# Patient Record
Sex: Female | Born: 1990 | Race: Black or African American | Hispanic: No | Marital: Single | Smoking: Current every day smoker
Health system: Southern US, Community
[De-identification: ages and names within clinical notes are randomized; demographics above are authoritative.]

## PROBLEM LIST (undated history)

## (undated) DIAGNOSIS — R569 Unspecified convulsions: Secondary | ICD-10-CM

---
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
## 2010-07-16 ENCOUNTER — Emergency Department (HOSPITAL_COMMUNITY): Admission: EM | Admit: 2010-07-16 | Discharge: 2010-07-16 | Payer: Self-pay | Admitting: Family Medicine

## 2011-02-24 NOTE — Op Note (Signed)
[name], April Burnett               ACCOUNT NO.:  [id number]   MEDICAL RECORD NO.:  [id number]          PATIENT TYPE:  AMB   LOCATION:  SDS                          FACILITY:  [facility]   PHYSICIAN:  Karol T. Lazarus Salines, M.D. DATE OF BIRTH:  [birth year]   DATE OF PROCEDURE:  DATE OF DISCHARGE:                               OPERATIVE REPORT   PREOPERATIVE DIAGNOSIS:  Right forehead abscess, culture positive for  methicillin-resistant Staphylococcus aureus.   POSTOPERATIVE DIAGNOSIS:  Right forehead abscess, culture positive for  methicillin-resistant Staphylococcus aureus.   PROCEDURE PERFORMED:  Incision and drainage, right forehead abscess.   SURGEON:  Gloris Manchester. Lazarus Salines, MD   ANESTHESIA:  General LMA.   BLOOD LOSS:  None.   COMPLICATIONS:  None.   FINDINGS:  A small incision and drainage site with a piece of Penrose  drain, however, not adequately drained.   PROCEDURE:  With the patient in a comfortable supine position, general  LMA anesthesia was administered.  At an appropriate level, the patient  was placed in a slight reverse Trendelenburg.  A clean preparation and  draping of the face was accomplished.   The previous Penrose drain was removed.  There was some additional pus  that was expressed.  Using hemostat, the cavity was explored including  the superior extension.  Additional loculations of pus were broken down  and drained.  The cavity was irrigated with 60 mL of 50:50 mixture of  Betadine solution and saline.  Hemostasis was spontaneous.  A sliver of  Penrose drain was placed into the depth of the cavity and then secured  at the skin edge with a 3-0 Ethilon stitch.  An absorbent dressing was  placed.  This completed the procedure.  The patient was returned to  Anesthesia, awakened, extubated, and transferred to recovery in stable  condition.   COMMENT:  A 20 year old black female underwent incision and drainage of  a right forehead abscess/infected epithelial cyst 5  days ago.  She has  had progressive disease despite being on clindamycin.  The culture did  show MRSA sensitive to clindamycin from that particular encounter.  Additional drainage was achieved today.  Given low anticipated risk of  postanesthetic or postsurgical complications, I feel an outpatient venue  is appropriate.      Gloris Manchester. Lazarus Salines, M.D.  Electronically Signed     KTW/MEDQ  D:  09/05/2008  T:  09/06/2008  Job:  [id number]

## 2011-02-24 NOTE — Consult Note (Signed)
April Burnett, April Burnett               ACCOUNT NO.:  [id number]   MEDICAL RECORD NO.:  [id number]          PATIENT TYPE:  EMS   LOCATION:  MAJO                         FACILITY:  [facility]   PHYSICIAN:  Karol T. Lazarus Salines, M.D. DATE OF BIRTH:  1990/11/25   DATE OF CONSULTATION:  09/02/2008  DATE OF DISCHARGE:  09/02/2008                                 CONSULTATION   CHIEF COMPLAINT:  Right forehead swelling.   HISTORY:  A 20 year old black female has developed progressively tender  swelling over her right forehead over roughly 48 hours' time.  It was  too painful to sleep this evening.  She did attempt to express something  and got a small quantity of pus with the infections continues to be  swollen and tender.  On specific questioning, there may have been a  small cyst or something in that area previously.  She is not diabetic or  otherwise immune compromised.  No history of MRSA.  She does form  keloids on both ear lobules related to earrings.  She is otherwise  healthy.   PHYSICAL EXAMINATION:  This is a slightly overweight adolescent black  female with an erythematous tender swelling just above the medial right  eyebrow in the forehead skin.  There is slight excoriation and a honey-  colored eschar over the center of the area consistent with a draining  abscess.  No obvious active drainage.  The right upper eyelid is  swollen, but the eye itself does not hurt.  Full range of motion and  intact vision.  No other examination was performed except to note that  she has some old acne scarring and slight hyperpigmentation and  prominent keloids on the posterior surface of the auricular lobule on  both sides.   IMPRESSION:  Right forehead abscess?  Infected epithelial cyst.  Possible methicillin-resistant Staphylococcus aureus.   PLAN:  With informed consent, I anesthetized the area beginning into the  lateral brow and working medially using 7 mL of 1% Xylocaine with  1:100,000 epinephrine  in stages.  An ice pack was used initially to help  with the needle stick.  After achieving adequate anesthesia, I palpated  the area further.  I had planned to put the incision in the eyebrow, but  the lesion itself is roughly 1 cm above the eyebrow and I incised  directly into the lesion using the tip of a #15 blade scalpel.  A small  amount of pus was encountered.  The opening was deepened with a hemostat  and some pus and epithelial debris was encountered.  This was cultured.  With forceful expression, loculations were broken down in all material  was delivered to the surface.  A sliver of Penrose drain was placed into  the wound and secured to the skin with a 3-0 Ethilon stitch.  She  tolerated the procedure nicely.  We cleaned the wound and applied  antibiotic ointment and a Band-Aid.  She is recommended to use an ice  pack for 24 hours, keep the head elevated for several days, and check  back in my office in 1-2  days for removal of the drain.  I doubt that  she will develop a keloid at this point, but we will be prepared to deal  with that in the office.  She was asked to contact me for worsening of  the infection.  I gave her prescriptions for Tylenol No. 3 for pain  relief, #20; and clindamycin 300 mg t.i.d. for 1 week.  We will call her  when the culture results become available and change antibiotics as  needed.      Gloris Manchester. Lazarus Salines, M.D.  Electronically Signed    KTW/MEDQ  D:  09/02/2008  T:  09/02/2008  Job:  [id number]

## 2011-02-27 NOTE — Op Note (Signed)
NAMEECE, CUMBERLAND                           ACCOUNT NO.:  [id number]   MEDICAL RECORD NO.:  [id number]                   PATIENT TYPE:  AMB   LOCATION:  DSC                                  FACILITY:  [facility]   PHYSICIAN:  Christopher E. Ezzard Standing, M.D.         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   OPERATION:  Adenoidectomy with bilateral inferior turbinate reductions.   SURGEON:  Kristine Garbe. Ezzard Standing, M.D.   ANESTHESIA:  General endotracheal anesthesia.   COMPLICATIONS:  None.   BRIEF CLINICAL NOTE:  Genine Beckett is a 20 year old who has had chronic  nasal obstruction with a tendency to breathe through her mouth and snores  heavily at night.  On examination, she has large, swollen turbinates as well  as moderately large adenoid tissue.  She is taken to the operating room at  this time for adenoidectomy and turbinate reduction.   DESCRIPTION OF PROCEDURE:  After adequate endotracheal anesthesia, the  nasopharynx was examined first.  The mouth gag was used to expose the  oropharynx.  A red rubber catheter was passed through the nose and out the  mouth to retract the soft palate.  The nasopharynx was examined with a  mirror.  Candee had moderate size adenoid tissue.  An adenoid curet was  used to remove the central pad of adenoid tissue.  Nasopharyngeal packs were  placed for hemostasis.  These were then removed and further hemostasis was  obtained with suction cautery.  After obtaining adequate hemostasis, the  procedure was completed.  The nose and nasopharynx was irrigated with  saline.   Following this, the nose was examined.  Anniah had large inferior  turbinates bilaterally, right  side a little bit larger than the left.  Using  the L-Med bipolar cautery, submucosal cauterization was performed of the  inferior turbinates bilaterally and the turbinate bone was outfractured.  This completed the procedure.  Deleah was awakened from anesthesia and  transferred to the recovery room postoperatively doing well.   DISPOSITION:  Nissi is discharged home later this morning on Tylenol #3  p.r.n. pain.  Will have her follow up in my office in two weeks for recheck.                                               Kristine Garbe. Ezzard Standing, M.D.    CEN/MEDQ  D:  01/18/2004  T:  01/18/2004  Job:  [id number]   cc:   Fanny Dance. Rankins, M.D.  [address]  [place]  [place] [address]  Fax: [phone]

## 2011-05-01 ENCOUNTER — Emergency Department (HOSPITAL_COMMUNITY)
Admission: EM | Admit: 2011-05-01 | Discharge: 2011-05-01 | Disposition: A | Discharge status: Home / Self Care | Payer: Self-pay | Attending: Emergency Medicine | Admitting: Emergency Medicine

## 2011-05-01 DIAGNOSIS — M79609 Pain in unspecified limb: Secondary | ICD-10-CM | POA: Insufficient documentation

## 2011-05-01 DIAGNOSIS — R110 Nausea: Secondary | ICD-10-CM | POA: Insufficient documentation

## 2011-05-01 DIAGNOSIS — IMO0002 Reserved for concepts with insufficient information to code with codable children: Secondary | ICD-10-CM | POA: Insufficient documentation

## 2011-05-05 ENCOUNTER — Emergency Department (HOSPITAL_COMMUNITY)
Admission: EM | Admit: 2011-05-05 | Discharge: 2011-05-05 | Disposition: A | Discharge status: Home / Self Care | Payer: Self-pay | Attending: Emergency Medicine | Admitting: Emergency Medicine

## 2011-05-05 DIAGNOSIS — L02419 Cutaneous abscess of limb, unspecified: Secondary | ICD-10-CM | POA: Insufficient documentation

## 2011-05-05 DIAGNOSIS — M79609 Pain in unspecified limb: Secondary | ICD-10-CM | POA: Insufficient documentation

## 2011-05-05 DIAGNOSIS — N61 Mastitis without abscess: Secondary | ICD-10-CM | POA: Insufficient documentation

## 2011-05-05 DIAGNOSIS — Z4800 Encounter for change or removal of nonsurgical wound dressing: Secondary | ICD-10-CM | POA: Insufficient documentation

## 2011-05-05 DIAGNOSIS — Z79899 Other long term (current) drug therapy: Secondary | ICD-10-CM | POA: Insufficient documentation

## 2011-05-05 DIAGNOSIS — L03119 Cellulitis of unspecified part of limb: Secondary | ICD-10-CM | POA: Insufficient documentation

## 2011-07-15 LAB — CULTURE, ROUTINE-ABSCESS

## 2011-07-15 LAB — CBC
Hemoglobin: 11.3 — ABNORMAL LOW
MCHC: 31.8
MCV: 73.3 — ABNORMAL LOW
RBC: 4.86
WBC: 8.2

## 2011-07-15 LAB — HCG, SERUM, QUALITATIVE: Preg, Serum: NEGATIVE

## 2011-07-28 LAB — CBC
HCT: 26.9 — ABNORMAL LOW
HCT: 32.5 — ABNORMAL LOW
Hemoglobin: 10.5 — ABNORMAL LOW
Hemoglobin: 8.7 — ABNORMAL LOW
MCHC: 32.5
MCV: 73.6 — ABNORMAL LOW
MCV: 74.6 — ABNORMAL LOW
Platelets: 311
RBC: 3.6 — ABNORMAL LOW
RDW: 14.5 — ABNORMAL HIGH

## 2011-09-28 ENCOUNTER — Emergency Department (HOSPITAL_COMMUNITY)
Admission: EM | Admit: 2011-09-28 | Discharge: 2011-09-28 | Disposition: A | Discharge status: Home / Self Care | Payer: Medicaid Other | Attending: Emergency Medicine | Admitting: Emergency Medicine

## 2011-09-28 ENCOUNTER — Encounter: Payer: Self-pay | Admitting: Emergency Medicine

## 2011-09-28 DIAGNOSIS — L02411 Cutaneous abscess of right axilla: Secondary | ICD-10-CM

## 2011-09-28 DIAGNOSIS — IMO0002 Reserved for concepts with insufficient information to code with codable children: Secondary | ICD-10-CM | POA: Insufficient documentation

## 2011-09-28 MED ORDER — DOXYCYCLINE HYCLATE 100 MG PO TABS
100.0000 mg | ORAL_TABLET | Freq: Once | ORAL | Status: AC
  Administered 2011-09-28: 100 mg via ORAL
  Filled 2011-09-28: qty 1

## 2011-09-28 MED ORDER — HYDROCODONE-ACETAMINOPHEN 5-500 MG PO TABS: 1.0000 | ORAL_TABLET | Freq: Four times a day (QID) | ORAL | Status: AC | PRN

## 2011-09-28 MED ORDER — HYDROCODONE-ACETAMINOPHEN 5-325 MG PO TABS
1.0000 | ORAL_TABLET | Freq: Once | ORAL | Status: DC
  Filled 2011-09-28: qty 1

## 2011-09-28 MED ORDER — DOXYCYCLINE HYCLATE 100 MG PO CAPS: 100.0000 mg | ORAL_CAPSULE | Freq: Two times a day (BID) | ORAL | Status: AC

## 2011-09-28 NOTE — ED Notes (Signed)
Rx x 2, pt voiced understanding to f/u with PCP for worsening condition.

## 2011-09-28 NOTE — ED Notes (Signed)
Pt c/o abscess to right axillary area x 1 week; pt denies drainage

## 2011-09-28 NOTE — ED Provider Notes (Signed)
History     CSN: [id number] Arrival date & time: 09/28/2011  5:34 PM   First MD Initiated Contact with Patient 09/28/11 2137      Chief Complaint  Patient presents with  . Abscess    (Consider location/radiation/quality/duration/timing/severity/associated sxs/prior treatment) HPI Comments: Patient reports, over the last 2, years.  She's had multiple abscesses, never in the same spot twice now has 3 days of painful swelling in the right axilla.  She's been putting on "fat back" abscess is now pointing  Patient is a 20 y.o. female presenting with abscess. The history is provided by the patient.  Abscess  This is a new problem. The current episode started yesterday. The onset was gradual. The problem occurs occasionally. The problem has been gradually worsening. The abscess is present on the right arm. The problem is moderate. The abscess is characterized by painfulness and swelling. The abscess first occurred at home. There were no sick contacts. She has received no recent medical care.    No past medical history on file.  No past surgical history on file.  No family history on file.  History  Substance Use Topics  . Smoking status: Never Smoker   . Smokeless tobacco: Not on file  . Alcohol Use: No    OB History    Grav Para Term Preterm Abortions TAB SAB Ect Mult Living                  Review of Systems  Constitutional: Negative.   HENT: Negative.   Eyes: Negative.   Respiratory: Negative.   Gastrointestinal: Negative.   Genitourinary: Negative.   Musculoskeletal: Negative.   Skin: Positive for wound.  Neurological: Negative.   Hematological: Negative.   Psychiatric/Behavioral: Negative.     Allergies  Review of patient's allergies indicates no known allergies.  Home Medications   Current Outpatient Rx  Name Route Sig Dispense Refill  . DOXYCYCLINE HYCLATE 100 MG PO CAPS Oral Take 1 capsule (100 mg total) by mouth 2 (two) times daily. 20 capsule 0  .  HYDROCODONE-ACETAMINOPHEN 5-500 MG PO TABS Oral Take 1-2 tablets by mouth every 6 (six) hours as needed for pain. 15 tablet 0    BP 124/77  Pulse 87  Temp(Src) 97.8 F (36.6 C) (Oral)  Resp 17  SpO2 100%  Physical Exam  Constitutional: She is oriented to person, place, and time. She appears well-developed.  HENT:  Head: Normocephalic.  Eyes: Pupils are equal, round, and reactive to light.  Neck: Normal range of motion.  Cardiovascular: Normal rate.   Pulmonary/Chest: Effort normal.  Abdominal: Soft.  Genitourinary: Vagina normal.  Musculoskeletal: Normal range of motion.  Neurological: She is oriented to person, place, and time.  Skin:       3cm oval abscess central R axilla   Psychiatric: She has a normal mood and affect.    ED Course  INCISION AND DRAINAGE Date/Time: 09/28/2011 10:21 PM Performed by: Arman Filter Authorized by: Arman Filter Consent: Verbal consent obtained. Risks and benefits: risks, benefits and alternatives were discussed Consent given by: patient Patient understanding: patient states understanding of the procedure being performed Patient identity confirmed: verbally with patient Time out: Immediately prior to procedure a "time out" was called to verify the correct patient, procedure, equipment, support staff and site/side marked as required. Type: abscess Body area: upper extremity Anesthesia: local infiltration Local anesthetic: lidocaine 1% without epinephrine Patient sedated: no Scalpel size: 11 Needle gauge: 22 Incision type: single straight Complexity:  simple Drainage: purulent Drainage amount: copious Wound treatment: drain placed Packing material: 1/4 in iodoform gauze Patient tolerance: Patient tolerated the procedure well with no immediate complications.   (including critical care time)  Labs Reviewed - No data to display No results found.   1. Abscess of axilla, right       MDM  Abscess in right axilla.  Will drain,  place, packing have patient return in 2 days for reexamination.  Also prescribe doxycycline and hydrocodone for pain         Arman Filter, NP 09/28/11 2223  Arman Filter, NP 09/28/11 2223  Arman Filter, NP 09/28/11 2226

## 2011-09-30 NOTE — ED Provider Notes (Signed)
Medical screening examination/treatment/procedure(s) were performed by non-physician practitioner and as supervising physician I was immediately available for consultation/collaboration.   Geoffery Lyons, MD 09/30/11 [phone]

## 2012-08-10 ENCOUNTER — Emergency Department (HOSPITAL_COMMUNITY): Payer: Self-pay

## 2012-08-10 ENCOUNTER — Encounter (HOSPITAL_COMMUNITY): Payer: Self-pay | Admitting: Emergency Medicine

## 2012-08-10 ENCOUNTER — Emergency Department (HOSPITAL_COMMUNITY)
Admission: EM | Admit: 2012-08-10 | Discharge: 2012-08-10 | Disposition: A | Discharge status: Home / Self Care | Payer: Self-pay | Attending: Emergency Medicine | Admitting: Emergency Medicine

## 2012-08-10 DIAGNOSIS — S51809A Unspecified open wound of unspecified forearm, initial encounter: Secondary | ICD-10-CM | POA: Insufficient documentation

## 2012-08-10 DIAGNOSIS — S51819A Laceration without foreign body of unspecified forearm, initial encounter: Secondary | ICD-10-CM

## 2012-08-10 MED ORDER — HYDROCODONE-ACETAMINOPHEN 5-325 MG PO TABS: 1.0000 | ORAL_TABLET | ORAL | Status: DC | PRN

## 2012-08-10 MED ORDER — HYDROCODONE-ACETAMINOPHEN 5-325 MG PO TABS
1.0000 | ORAL_TABLET | Freq: Once | ORAL | Status: AC
  Administered 2012-08-10: 1 via ORAL
  Filled 2012-08-10: qty 1
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
## 2012-08-10 NOTE — ED Notes (Signed)
Pt walked to bathroom without assistance.
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
## 2012-08-10 NOTE — ED Provider Notes (Signed)
Medical screening examination/treatment/procedure(s) were performed by non-physician practitioner and as supervising physician I was immediately available for consultation/collaboration.  Jones Skene, M.D.     Jones Skene, MD 08/10/12 1610

## 2013-02-07 ENCOUNTER — Encounter (HOSPITAL_COMMUNITY): Payer: Self-pay | Admitting: Emergency Medicine

## 2013-02-07 ENCOUNTER — Emergency Department (INDEPENDENT_AMBULATORY_CARE_PROVIDER_SITE_OTHER)
Admission: EM | Admit: 2013-02-07 | Discharge: 2013-02-07 | Disposition: A | Discharge status: Home / Self Care | Payer: Self-pay | Source: Home / Self Care | Attending: Emergency Medicine | Admitting: Emergency Medicine

## 2013-02-07 DIAGNOSIS — J020 Streptococcal pharyngitis: Secondary | ICD-10-CM

## 2013-02-07 LAB — POCT RAPID STREP A: Streptococcus, Group A Screen (Direct): POSITIVE — AB

## 2013-02-07 MED ORDER — AMOXICILLIN 500 MG PO CAPS: 500.0000 mg | ORAL_CAPSULE | Freq: Three times a day (TID) | ORAL | Status: AC

## 2013-02-07 NOTE — ED Notes (Signed)
Pt c/o sore throat onset 2 days Sx include: cough w/green mucous and hurts to swallow Denies: f/v/n/d Has not had any meds for discomfort  She is alert and oriented w/no signs of acute distress.

## 2013-02-07 NOTE — ED Provider Notes (Signed)
History     CSN: [id number]  Arrival date & time 02/07/13  1555   First MD Initiated Contact with Patient 02/07/13 1649      Chief Complaint  Patient presents with  . Sore Throat    (Consider location/radiation/quality/duration/timing/severity/associated sxs/prior treatment) HPI Comments: Patient presents urgent care describing a sore throat for 2 days with mild cough of productive sputum. Denies any fevers nausea or vomiting. Does hurt to swallow. No rashes and no headaches.  Patient is a 22 y.o. female presenting with pharyngitis. The history is provided by the patient.  Sore Throat This is a new problem. The current episode started 2 days ago. The problem occurs constantly. The problem has not changed since onset.The symptoms are aggravated by swallowing. Nothing relieves the symptoms. She has tried nothing for the symptoms.    History reviewed. No pertinent past medical history.  History reviewed. No pertinent past surgical history.  No family history on file.  History  Substance Use Topics  . Smoking status: Never Smoker   . Smokeless tobacco: Not on file  . Alcohol Use: No    OB History   Grav Para Term Preterm Abortions TAB SAB Ect Mult Living                  Review of Systems  Constitutional: Positive for fever and appetite change. Negative for activity change.  HENT: Positive for congestion, sore throat, trouble swallowing and sinus pressure. Negative for facial swelling, neck pain and neck stiffness.   Skin: Negative for rash.    Allergies  Review of patient's allergies indicates no known allergies.  Home Medications   Current Outpatient Rx  Name  Route  Sig  Dispense  Refill  . amoxicillin (AMOXIL) 500 MG capsule   Oral   Take 1 capsule (500 mg total) by mouth 3 (three) times daily.   30 capsule   0   . HYDROcodone-acetaminophen (NORCO/VICODIN) 5-325 MG per tablet   Oral   Take 1 tablet by mouth every 4 (four) hours as needed for pain.   10  tablet   0     BP 126/69  Pulse 83  Temp(Src) 98.6 F (37 C) (Oral)  Resp 16  SpO2 100%  Physical Exam  Nursing note and vitals reviewed. Constitutional: She appears well-developed and well-nourished.  HENT:  Mouth/Throat: Uvula is midline. Oropharyngeal exudate and posterior oropharyngeal erythema present. No posterior oropharyngeal edema or tonsillar abscesses.    Eyes: Conjunctivae are normal.  Neck: Neck supple. No JVD present. No thyromegaly present.  Lymphadenopathy:    She has no cervical adenopathy.  Neurological: She is alert.  Skin: No rash noted. No erythema.    ED Course  Procedures (including critical care time)  Labs Reviewed  POCT RAPID STREP A ([facility] ONLY) - Abnormal; Notable for the following:    Streptococcus, Group A Screen (Direct) POSITIVE (*)    All other components within normal limits   No results found.   1. Streptococcal pharyngitis       MDM  Uncomplicated streptococcal pharyngitis patient has been prescribed amoxicillin        Jimmie Molly, MD 02/07/13 2039

## 2015-05-26 ENCOUNTER — Encounter (HOSPITAL_COMMUNITY): Payer: Self-pay | Admitting: Emergency Medicine

## 2015-05-26 ENCOUNTER — Emergency Department (HOSPITAL_COMMUNITY)
Admission: EM | Admit: 2015-05-26 | Discharge: 2015-05-26 | Disposition: A | Discharge status: Home / Self Care | Payer: Medicaid Other | Attending: Emergency Medicine | Admitting: Emergency Medicine

## 2015-05-26 DIAGNOSIS — R112 Nausea with vomiting, unspecified: Secondary | ICD-10-CM | POA: Insufficient documentation

## 2015-05-26 DIAGNOSIS — R197 Diarrhea, unspecified: Secondary | ICD-10-CM | POA: Insufficient documentation

## 2015-05-26 LAB — I-STAT BETA HCG BLOOD, ED (MC, WL, AP ONLY): I-stat hCG, quantitative: <5 m[IU]/mL (ref <5)

## 2015-05-26 LAB — CBC
HCT: 37.7 % (ref 36.0–46.0)
HEMOGLOBIN: 12.3 g/dL (ref 12.0–15.0)
MCH: 26.5 pg (ref 26.0–34.0)
MCHC: 32.6 g/dL (ref 30.0–36.0)
MCV: 81.1 fL (ref 78.0–100.0)
PLATELETS: 271 10*3/uL (ref 150–400)
RBC: 4.65 MIL/uL (ref 3.87–5.11)
RDW: 14.7 % (ref 11.5–15.5)
WBC: 6.4 10*3/uL (ref 4.0–10.5)

## 2015-05-26 LAB — COMPREHENSIVE METABOLIC PANEL
ALK PHOS: 46 U/L (ref 38–126)
ALT: 6 U/L — ABNORMAL LOW (ref 14–54)
AST: 14 U/L — ABNORMAL LOW (ref 15–41)
Albumin: 3.8 g/dL (ref 3.5–5.0)
Anion gap: 7 (ref 5–15)
BILIRUBIN TOTAL: 0.5 mg/dL (ref 0.3–1.2)
BUN: 11 mg/dL (ref 6–20)
CO2: 24 mmol/L (ref 22–32)
CREATININE: 0.65 mg/dL (ref 0.44–1.00)
Calcium: 8.7 mg/dL — ABNORMAL LOW (ref 8.9–10.3)
Chloride: 103 mmol/L (ref 101–111)
GFR calc Af Amer: >60 mL/min (ref >60)
GLUCOSE: 105 mg/dL — AB (ref 65–99)
Potassium: 3.6 mmol/L (ref 3.5–5.1)
Sodium: 134 mmol/L — ABNORMAL LOW (ref 135–145)
TOTAL PROTEIN: 6.9 g/dL (ref 6.5–8.1)

## 2015-05-26 LAB — LIPASE, BLOOD: LIPASE: 15 U/L — AB (ref 22–51)

## 2015-05-26 MED ORDER — ONDANSETRON 4 MG PO TBDP
ORAL_TABLET | ORAL | Status: AC
  Filled 2015-05-26: qty 1

## 2015-05-26 MED ORDER — ONDANSETRON 4 MG PO TBDP: 4.0000 mg | ORAL_TABLET | Freq: Three times a day (TID) | ORAL | Status: DC | PRN

## 2015-05-26 MED ORDER — ONDANSETRON 4 MG PO TBDP
4.0000 mg | ORAL_TABLET | Freq: Once | ORAL | Status: AC | PRN
  Administered 2015-05-26: 4 mg via ORAL

## 2015-05-26 NOTE — Discharge Instructions (Signed)

## 2015-05-26 NOTE — ED Notes (Signed)
Patient here with complaint of vomiting and diarrhea starting yesterday. Reports that her entire family has been getting the same thing. Reports mild abdominal pain.

## 2015-05-26 NOTE — ED Provider Notes (Signed)
CSN: [id number]     Arrival date & time 05/26/15  4098 History   First MD Initiated Contact with Patient 05/26/15 [phone]     Chief Complaint  Patient presents with  . Emesis  . Diarrhea   April Burnett is a 24 y.o. female who is otherwise healthy who presents to the ED complaining of nausea, vomiting and diarrhea since yesterday. The patient reports that her grandmother and son were previously sick with the same illness. Patient reports vomiting 6-7 times in the past 24 hours. She reports having 2 episodes of diarrhea in the past 24 hours. She denies hematemesis or hematochezia. Patient reports she was previously nauseated but after Zofran her nausea has completely resolved. She denies ever having any abdominal pain just nausea. Patient denies history of previous abdominal surgeries. The patient denies fevers, chills, suspicious food intake, abdominal pain, urinary symptoms, rashes, hematemesis, hematuria, lightheadedness, dizziness or weakness.  (Consider location/radiation/quality/duration/timing/severity/associated sxs/prior Treatment) HPI  History reviewed. No pertinent past medical history. History reviewed. No pertinent past surgical history. History reviewed. No pertinent family history. Social History  Substance Use Topics  . Smoking status: Never Smoker   . Smokeless tobacco: None  . Alcohol Use: No   OB History    No data available     Review of Systems  Constitutional: Negative for fever and chills.  HENT: Negative for congestion and sore throat.   Eyes: Negative for visual disturbance.  Respiratory: Negative for cough, shortness of breath and wheezing.   Cardiovascular: Negative for chest pain and palpitations.  Gastrointestinal: Positive for nausea, vomiting and diarrhea. Negative for abdominal pain and blood in stool.  Genitourinary: Negative for dysuria, urgency, frequency, hematuria, flank pain, decreased urine volume and difficulty urinating.  Musculoskeletal:  Negative for back pain and neck pain.  Skin: Negative for rash.  Neurological: Negative for dizziness, weakness, light-headedness and headaches.      Allergies  Review of patient's allergies indicates no known allergies.  Home Medications   Prior to Admission medications   Medication Sig Start Date End Date Taking? Authorizing Provider  HYDROcodone-acetaminophen (NORCO/VICODIN) 5-325 MG per tablet Take 1 tablet by mouth every 4 (four) hours as needed for pain. 08/10/12   Catherine Schinlever, PA-C  ondansetron (ZOFRAN ODT) 4 MG disintegrating tablet Take 1 tablet (4 mg total) by mouth every 8 (eight) hours as needed for nausea or vomiting. 05/26/15   Everlene Farrier, PA-C   BP 104/55 mmHg  Pulse 52  Temp(Src) 98 F (36.7 C) (Oral)  Resp 18  Ht 5\' 3"  (1.6 m)  Wt 144 lb (65.318 kg)  BMI 25.51 kg/m2  SpO2 100% Physical Exam  Constitutional: She is oriented to person, place, and time. She appears well-developed and well-nourished. No distress.  Nontoxic appearing.  HENT:  Head: Normocephalic and atraumatic.  Mouth/Throat: Oropharynx is clear and moist.  Eyes: Conjunctivae are normal. Pupils are equal, round, and reactive to light. Right eye exhibits no discharge. Left eye exhibits no discharge.  Neck: Neck supple.  Cardiovascular: Normal rate, regular rhythm, normal heart sounds and intact distal pulses.  Exam reveals no gallop and no friction rub.   No murmur heard. Pulmonary/Chest: Effort normal and breath sounds normal. No respiratory distress. She has no wheezes. She has no rales.  Abdominal: Soft. Bowel sounds are normal. She exhibits no distension and no mass. There is no tenderness. There is no rebound and no guarding.  Abdomen is soft and nontender to palpation. Bowel sounds are present. No McBurney's point  tenderness. No rebound tenderness. Negative psoas and obturator sign.  Musculoskeletal: She exhibits no edema.  Lymphadenopathy:    She has no cervical adenopathy.   Neurological: She is alert and oriented to person, place, and time. Coordination normal.  Skin: Skin is warm and dry. No rash noted. She is not diaphoretic. No erythema. No pallor.  Psychiatric: She has a normal mood and affect. Her behavior is normal.  Nursing note and vitals reviewed.   ED Course  Procedures (including critical care time) Labs Review Labs Reviewed  LIPASE, BLOOD - Abnormal; Notable for the following:    Lipase 15 (*)    All other components within normal limits  COMPREHENSIVE METABOLIC PANEL - Abnormal; Notable for the following:    Sodium 134 (*)    Glucose, Bld 105 (*)    Calcium 8.7 (*)    AST 14 (*)    ALT 6 (*)    All other components within normal limits  CBC  I-STAT BETA HCG BLOOD, ED ([facility], [facility], [facility] ONLY)    Imaging Review No results found. I, Lawana Chambers, personally reviewed and evaluated these and lab results as part of my medical decision-making.   EKG Interpretation None      Filed Vitals:   05/26/15 0845 05/26/15 0901 05/26/15 0915 05/26/15 0941  BP: 105/50 106/51 104/55 105/56  Pulse: 57 52 52 55  Temp:      TempSrc:      Resp:  18  18  Height:      Weight:      SpO2: 100% 100% 100% 100%     MDM   Meds given in ED:  Medications  ondansetron (ZOFRAN-ODT) disintegrating tablet 4 mg (4 mg Oral Given 05/26/15 0706)    New Prescriptions   ONDANSETRON (ZOFRAN ODT) 4 MG DISINTEGRATING TABLET    Take 1 tablet (4 mg total) by mouth every 8 (eight) hours as needed for nausea or vomiting.    Final diagnoses:  Nausea vomiting and diarrhea   This is a 24 y.o. female who is otherwise healthy who presents to the ED complaining of nausea, vomiting and diarrhea since yesterday. The patient reports that her grandmother and son were previously sick with the same illness. The patient denies ever having any abdominal pain. Patient reports after receiving Zofran in the emergency department she is feeling back to normal. She reports her  nausea has completely resolved. She has tolerated water in the emergency department without vomiting. She is in no episodes of diarrhea while in the emergency department. Patient reports feeling ready for discharge. Patient has a negative i-STAT hCG. Lipase is 17. CMP is unremarkable. CBC is within normal limits. We'll discharge the patient prescription for Zofran. Education on [employer] I advised the patient to follow-up with their primary care provider this week. I advised the patient to return to the emergency department with new or worsening symptoms or new concerns. The patient verbalized understanding and agreement with plan.       Everlene Farrier, PA-C 05/26/15 1610  Cathren Laine, MD 05/30/15 [phone]

## 2016-08-15 ENCOUNTER — Encounter (HOSPITAL_COMMUNITY): Payer: Self-pay | Admitting: Emergency Medicine

## 2016-08-15 ENCOUNTER — Ambulatory Visit (HOSPITAL_COMMUNITY)
Admission: EM | Admit: 2016-08-15 | Discharge: 2016-08-15 | Disposition: A | Discharge status: Home / Self Care | Payer: Medicaid Other | Attending: Emergency Medicine | Admitting: Emergency Medicine

## 2016-08-15 DIAGNOSIS — K029 Dental caries, unspecified: Secondary | ICD-10-CM

## 2016-08-15 DIAGNOSIS — K047 Periapical abscess without sinus: Secondary | ICD-10-CM

## 2016-08-15 MED ORDER — AMOXICILLIN 500 MG PO CAPS: 1000.0000 mg | ORAL_CAPSULE | Freq: Two times a day (BID) | ORAL | 0 refills | Status: AC

## 2016-08-15 MED ORDER — HYDROCODONE-ACETAMINOPHEN 5-325 MG PO TABS: 1.0000 | ORAL_TABLET | Freq: Four times a day (QID) | ORAL | 0 refills | Status: DC | PRN

## 2016-08-15 MED ORDER — HYDROCODONE-ACETAMINOPHEN 5-325 MG PO TABS
1.0000 | ORAL_TABLET | Freq: Once | ORAL | Status: AC
Start: 2016-08-15 — End: 2016-08-15
  Administered 2016-08-15: 1 via ORAL

## 2016-08-15 MED ORDER — HYDROCODONE-ACETAMINOPHEN 5-325 MG PO TABS
ORAL_TABLET | ORAL | Status: AC
  Filled 2016-08-15: qty 1

## 2016-08-15 NOTE — Discharge Instructions (Signed)
You have a cavity in your tooth that has become infected. Take amoxicillin twice a day for 10 days. You can take ibuprofen 3 times a day to help with the pain. Use the hydrocodone as prescribed for severe pain. You need to see a dentist as soon as possible.

## 2016-08-15 NOTE — ED Provider Notes (Signed)
[facility]    CSN: [id number] Arrival date & time: 08/15/16  1210     History   Chief Complaint Chief Complaint  Patient presents with  . Dental Pain    HPI April Burnett is a 25 y.o. female.   HPI She is a 25 year old woman here for evaluation of dental pain. She reports pain in the left upper molar. She states pain is been going on for about a month, but has been getting more constant and more severe. About 2 days ago, she noted swelling to the left upper jaw. She reports subjective fevers. She is unable to chew on the left side. No difficulty swallowing. No drainage. She has been taking 800 mg ibuprofen tablets 6-7 times a day without improvement.  She does not have a dentist.  History reviewed. No pertinent past medical history.  There are no active problems to display for this patient.   History reviewed. No pertinent surgical history.  OB History    No data available       Home Medications    Prior to Admission medications   Medication Sig Start Date End Date Taking? Authorizing Provider  amoxicillin (AMOXIL) 500 MG capsule Take 2 capsules (1,000 mg total) by mouth 2 (two) times daily. 08/15/16 08/25/16  Charm RingsErin J Ameia Morency, MD  HYDROcodone-acetaminophen (NORCO) 5-325 MG tablet Take 1 tablet by mouth every 6 (six) hours as needed for moderate pain. 08/15/16   Charm RingsErin J Shalamar Plourde, MD    Family History No family history on file.  Social History Social History  Substance Use Topics  . Smoking status: Current Every Day Smoker    Packs/day: 0.50    Types: Cigars  . Smokeless tobacco: Never Used  . Alcohol use No     Allergies   Review of patient's allergies indicates no known allergies.   Review of Systems Review of Systems As in history of present illness  Physical Exam Triage Vital Signs ED Triage Vitals  Enc Vitals Group     BP 08/15/16 1258 110/68     Pulse Rate 08/15/16 1258 65     Resp 08/15/16 1258 20     Temp 08/15/16 1258 98.2 F (36.8  C)     Temp Source 08/15/16 1258 Oral     SpO2 08/15/16 1258 100 %     Weight --      Height --      Head Circumference --      Peak Flow --      Pain Score 08/15/16 1300 10     Pain Loc --      Pain Edu? --      Excl. in GC? --    No data found.   Updated Vital Signs BP 110/68 (BP Location: Left Arm)   Pulse 65   Temp 98.2 F (36.8 C) (Oral)   Resp 20   SpO2 100%   Visual Acuity Right Eye Distance:   Left Eye Distance:   Bilateral Distance:    Right Eye Near:   Left Eye Near:    Bilateral Near:     Physical Exam  Constitutional: She is oriented to person, place, and time. She appears well-developed and well-nourished. No distress.  HENT:  Mouth/Throat:    Cardiovascular: Normal rate.   Pulmonary/Chest: Effort normal.  Neurological: She is alert and oriented to person, place, and time.     UC Treatments / Results  Labs (all labs ordered are listed, but only abnormal results are  displayed) Labs Reviewed - No data to display  EKG  EKG Interpretation None       Radiology No results found.  Procedures Procedures (including critical care time)  Medications Ordered in UC Medications  HYDROcodone-acetaminophen (NORCO/VICODIN) 5-325 MG per tablet 1 tablet (1 tablet Oral Given 08/15/16 1350)     Initial Impression / Assessment and Plan / UC Course  I have reviewed the triage vital signs and the nursing notes.  Pertinent labs & imaging results that were available during my care of the patient were reviewed by me and considered in my medical decision making (see chart for details).  Clinical Course    Hydrocodone given here due to pain elicited during exam. Treat with amoxicillin and hydrocodone. Resource guide for dental follow-up provided.  Final Clinical Impressions(s) / UC Diagnoses   Final diagnoses:  Dental infection  Dental caries    New Prescriptions Discharge Medication List as of 08/15/2016  1:42 PM    START taking these  medications   Details  amoxicillin (AMOXIL) 500 MG capsule Take 2 capsules (1,000 mg total) by mouth 2 (two) times daily., Starting Sat 08/15/2016, Until Tue 08/25/2016, Print         Charm RingsErin J Katricia Prehn, MD 08/15/16 [phone]

## 2016-08-15 NOTE — ED Triage Notes (Signed)
Here for left side dental pain onset 1 month associated w/facial swelling   Denies fevers  Taking ibup w/no relief.   A&O x4... NAD

## 2017-05-20 ENCOUNTER — Ambulatory Visit: Payer: Self-pay | Admitting: Physician Assistant

## 2017-09-12 ENCOUNTER — Encounter (HOSPITAL_COMMUNITY): Payer: Self-pay | Admitting: Emergency Medicine

## 2017-09-12 ENCOUNTER — Emergency Department (HOSPITAL_COMMUNITY): Payer: Self-pay

## 2017-09-12 ENCOUNTER — Emergency Department (HOSPITAL_COMMUNITY)
Admission: EM | Admit: 2017-09-12 | Discharge: 2017-09-12 | Disposition: A | Discharge status: Home / Self Care | Payer: Self-pay | Attending: Emergency Medicine | Admitting: Emergency Medicine

## 2017-09-12 DIAGNOSIS — Z79899 Other long term (current) drug therapy: Secondary | ICD-10-CM | POA: Insufficient documentation

## 2017-09-12 DIAGNOSIS — R569 Unspecified convulsions: Secondary | ICD-10-CM | POA: Insufficient documentation

## 2017-09-12 DIAGNOSIS — F17290 Nicotine dependence, other tobacco product, uncomplicated: Secondary | ICD-10-CM | POA: Insufficient documentation

## 2017-09-12 LAB — CBC
HCT: 38.6 % (ref 36.0–46.0)
HEMOGLOBIN: 12.8 g/dL (ref 12.0–15.0)
MCH: 27.1 pg (ref 26.0–34.0)
MCHC: 33.2 g/dL (ref 30.0–36.0)
MCV: 81.6 fL (ref 78.0–100.0)
Platelets: 315 10*3/uL (ref 150–400)
RBC: 4.73 MIL/uL (ref 3.87–5.11)
RDW: 15 % (ref 11.5–15.5)
WBC: 7.6 10*3/uL (ref 4.0–10.5)

## 2017-09-12 LAB — BASIC METABOLIC PANEL
ANION GAP: 8 (ref 5–15)
BUN: 7 mg/dL (ref 6–20)
CALCIUM: 9.4 mg/dL (ref 8.9–10.3)
CHLORIDE: 101 mmol/L (ref 101–111)
CO2: 25 mmol/L (ref 22–32)
Creatinine, Ser: 0.76 mg/dL (ref 0.44–1.00)
GFR calc Af Amer: >60 mL/min (ref >60)
GFR calc non Af Amer: >60 mL/min (ref >60)
GLUCOSE: 151 mg/dL — AB (ref 65–99)
Potassium: 3.9 mmol/L (ref 3.5–5.1)
Sodium: 134 mmol/L — ABNORMAL LOW (ref 135–145)

## 2017-09-12 LAB — RAPID URINE DRUG SCREEN, HOSP PERFORMED
AMPHETAMINES: NOT DETECTED
BENZODIAZEPINES: NOT DETECTED
Barbiturates: NOT DETECTED
COCAINE: NOT DETECTED
OPIATES: NOT DETECTED
Tetrahydrocannabinol: POSITIVE — AB

## 2017-09-12 LAB — URINALYSIS, ROUTINE W REFLEX MICROSCOPIC
Bilirubin Urine: NEGATIVE
GLUCOSE, UA: NEGATIVE mg/dL
Hgb urine dipstick: NEGATIVE
KETONES UR: NEGATIVE mg/dL
Nitrite: NEGATIVE
PROTEIN: 30 mg/dL — AB
Specific Gravity, Urine: 1.02 (ref 1.005–1.030)
pH: 7 (ref 5.0–8.0)

## 2017-09-12 LAB — I-STAT BETA HCG BLOOD, ED (MC, WL, AP ONLY)

## 2017-09-12 LAB — PHOSPHORUS: Phosphorus: 2.9 mg/dL (ref 2.5–4.6)

## 2017-09-12 LAB — MAGNESIUM: Magnesium: 1.9 mg/dL (ref 1.7–2.4)

## 2017-09-12 MED ORDER — LEVETIRACETAM 500 MG PO TABS: 500.0000 mg | ORAL_TABLET | Freq: Two times a day (BID) | ORAL | 0 refills | Status: AC

## 2017-09-12 NOTE — ED Triage Notes (Signed)
Pt states "I vaguely remember my head banging against the wall and when I woke up I had peed on myself." no hx of seizures that patient is aware of.

## 2017-09-12 NOTE — ED Notes (Signed)
Patient verbalized understanding of discharge instructions and denies any further needs or questions at this time. VS stable. Patient left with all belongings, ambulatory with steady gait.

## 2017-09-12 NOTE — ED Notes (Signed)
EDP at bedside  

## 2017-09-12 NOTE — ED Notes (Signed)
Spoke with MD Long regarding concern of new onset seizures. Verbal orders received for EKG and CT head. Orders placed accordingly.

## 2017-09-12 NOTE — ED Triage Notes (Signed)
Pt to ER for evaluation of syncope x 5 years states "it just happens off and on." states last episode this morning at 2:30 am, associated with diaphoresis and light headedness. States "afterwards I'm tired." pt in NAD. A/o x4.

## 2017-09-12 NOTE — ED Provider Notes (Signed)
[facility]   CSN: [id number] Arrival date & time: 09/12/17  1233     History   Chief Complaint Chief Complaint  Patient presents with  . Seizures    possible new onset seizures    HPI April Burnett is a 26 y.o. female without significant PMHx, presenting to the ED with 5 year hx of episodes of "passing out" that occur every 2-3 months. Pt states sometimes the episodes are witnessed and she has been told her body convulses/shakes. She states once she comes back to consciousness she feels sweaty and tired for <1 hour following the episode. Reports episode that occurred early this morning around 0230. She states she was awake and then all of a sudden she was coming back to consciousness and had urinated on herself. Reports assoc diaphoresis and lightheadedness. States she is currently at her baseline. Denies HA, vision changes, nausea, CP, abd pain, SOB, or any other complaints in the ED. Endorses occasional marijuana use. Denies any other illicit drug use. Denies alcohol use. No daily medications. No recent illnesses.  No personal or family hx of seizure disorder.  The history is provided by the patient.    History reviewed. No pertinent past medical history.  There are no active problems to display for this patient.   History reviewed. No pertinent surgical history.  OB History    No data available       Home Medications    Prior to Admission medications   Medication Sig Start Date End Date Taking? Authorizing Provider  ibuprofen (ADVIL,MOTRIN) 200 MG tablet Take 400-800 mg by mouth every 6 (six) hours as needed for mild pain.   Yes [provider]  levonorgestrel (MIRENA) 20 MCG/24HR IUD 1 each by Intrauterine route once.   Yes [provider]  HYDROcodone-acetaminophen (NORCO) 5-325 MG tablet Take 1 tablet by mouth every 6 (six) hours as needed for moderate pain. Patient not taking: Reported on  09/12/2017 08/15/16   Charm RingsHonig, Erin J, MD  levETIRAcetam (KEPPRA) 500 MG tablet Take 1 tablet (500 mg total) by mouth 2 (two) times daily. 09/12/17 10/12/17  Robinson, [place] N, PA-C    Family History History reviewed. No pertinent family history.  Social History Social History   Tobacco Use  . Smoking status: Current Every Day Smoker    Packs/day: 0.50    Types: Cigars  . Smokeless tobacco: Never Used  Substance Use Topics  . Alcohol use: No  . Drug use: No     Allergies   Patient has no known allergies.   Review of Systems Review of Systems  Constitutional: Positive for diaphoresis. Negative for fever.  Eyes: Negative for visual disturbance.  Respiratory: Negative for shortness of breath.   Cardiovascular: Negative for chest pain.  Gastrointestinal: Negative for abdominal pain and nausea.  Neurological: Positive for syncope and light-headedness. Negative for speech difficulty and headaches.  Psychiatric/Behavioral: Negative for confusion.  All other systems reviewed and are negative.    Physical Exam Updated Vital Signs BP 127/62 (BP Location: Right Arm)   Pulse 76   Temp 99.2 F (37.3 C) (Oral)   Resp 18   Ht 5\' 3"  (1.6 m)   Wt 72.6 kg (160 lb)   SpO2 100%   BMI 28.34 kg/m   Physical Exam  Constitutional: She appears well-developed and well-nourished.  HENT:  Head: Normocephalic and atraumatic.  Eyes: Conjunctivae are normal.  Pulmonary/Chest: Effort normal.  Abdominal: Soft.  Neurological: She is  alert.  Skin: Skin is warm.  Psychiatric: She has a normal mood and affect. Her behavior is normal.  Nursing note and vitals reviewed.    ED Treatments / Results  Labs (all labs ordered are listed, but only abnormal results are displayed) Labs Reviewed  BASIC METABOLIC PANEL - Abnormal; Notable for the following components:      Result Value   Sodium 134 (*)    Glucose, Bld 151 (*)    All other components within normal limits  URINALYSIS, ROUTINE W  REFLEX MICROSCOPIC - Abnormal; Notable for the following components:   APPearance CLOUDY (*)    Protein, ur 30 (*)    Leukocytes, UA SMALL (*)    Bacteria, UA MANY (*)    Squamous Epithelial / LPF 6-30 (*)    All other components within normal limits  RAPID URINE DRUG SCREEN, HOSP PERFORMED - Abnormal; Notable for the following components:   Tetrahydrocannabinol POSITIVE (*)    All other components within normal limits  CBC  MAGNESIUM  PHOSPHORUS  CBG MONITORING, ED  I-STAT BETA HCG BLOOD, ED ([facility], [facility], [facility] ONLY)    EKG  EKG Interpretation None       Radiology Ct Head Wo Contrast  Result Date: 09/12/2017 CLINICAL DATA:  Seizure EXAM: CT HEAD WITHOUT CONTRAST TECHNIQUE: Contiguous axial images were obtained from the base of the skull through the vertex without intravenous contrast. COMPARISON:  None. FINDINGS: Brain: No acute intracranial abnormality. Specifically, no hemorrhage, hydrocephalus, mass lesion, acute infarction, or significant intracranial injury. Vascular: No hyperdense vessel or unexpected calcification. Skull: No acute calvarial abnormality. Sinuses/Orbits: Visualized paranasal sinuses and mastoids clear. Orbital soft tissues unremarkable. Other: None IMPRESSION: No acute intracranial abnormality. Electronically Signed   By: Charlett NoseKevin  Dover M.D.   On: 09/12/2017 14:23    Procedures Procedures (including critical care time)  Medications Ordered in ED Medications - No data to display   Initial Impression / Assessment and Plan / ED Course  I have reviewed the triage vital signs and the nursing notes.  Pertinent labs & imaging results that were available during my care of the patient were reviewed by me and considered in my medical decision making (see chart for details).     Pt presenting with seizure-like episodes that have been occurring every 2-263months for about 5 years. Pt has never been evaluated by neurology for these episodes. Episode today was unwitnessed,  with syncope and urinary incontinence. She reported an episode of feeling drowsy and lightheaded that lasted <1hour following episode. In ED, she is at her baseline. Normal neuro exam. CBC, BMP, CBG wnl. Endorses marijuana use, denies other drugs or alcohol. CT head neg. EKG in NSR.  Spoke with Dr. Laurence SlateAroor with Neurology; recommends magnesium, phosphorus, and outpt neurology referral for outpatient MRI. Also recommends start keppra 500mg  BID.  Magnesium and phosphorus levels are normal. Pt is well-appearing and agreeable to plan for discharge with outpt neurology follow up and begin keppra. Safe for discharge.  Pt discussed with Dr. Fredderick PhenixBelfi, who agrees with care plan.  Discussed results, findings, treatment and follow up. Patient advised of return precautions. Patient verbalized understanding and agreed with plan.  Final Clinical Impressions(s) / ED Diagnoses   Final diagnoses:  Seizure-like activity [facility])    ED Discharge Orders        Ordered    levETIRAcetam (KEPPRA) 500 MG tablet  2 times daily     09/12/17 1747    Ambulatory referral to Neurology    Comments:  An appointment is requested in approximately: 4 weeks   09/12/17 1747       Robinson, [place] N, PA-C 09/12/17 1800    Rolan Bucco, MD 09/12/17 [phone]

## 2017-09-12 NOTE — Discharge Instructions (Signed)
Please read instructions below. Begin taking the medication, Keppra, 2 times per day.  You will receive a call from the Neurology specialist to schedule an appointment. If you have not heard from them within a week, I have provided their contact information in this paperwork. Return to the ER for severe headache, vision changes, or new or concerning symptoms.

## 2018-01-11 ENCOUNTER — Encounter: Payer: Self-pay | Admitting: Physician Assistant

## 2018-02-06 ENCOUNTER — Emergency Department (HOSPITAL_COMMUNITY)
Admission: EM | Admit: 2018-02-06 | Discharge: 2018-02-06 | Disposition: A | Discharge status: Home / Self Care | Payer: Self-pay | Attending: Emergency Medicine | Admitting: Emergency Medicine

## 2018-02-06 ENCOUNTER — Encounter (HOSPITAL_COMMUNITY): Payer: Self-pay | Admitting: Emergency Medicine

## 2018-02-06 DIAGNOSIS — F17290 Nicotine dependence, other tobacco product, uncomplicated: Secondary | ICD-10-CM | POA: Insufficient documentation

## 2018-02-06 DIAGNOSIS — K047 Periapical abscess without sinus: Secondary | ICD-10-CM | POA: Insufficient documentation

## 2018-02-06 MED ORDER — HYDROCODONE-ACETAMINOPHEN 5-325 MG PO TABS: 1.0000 | ORAL_TABLET | Freq: Four times a day (QID) | ORAL | 0 refills | Status: DC | PRN

## 2018-02-06 MED ORDER — AMOXICILLIN 500 MG PO CAPS: 500.0000 mg | ORAL_CAPSULE | Freq: Three times a day (TID) | ORAL | 0 refills | Status: DC

## 2018-02-06 NOTE — ED Notes (Signed)
Patient called x 2 with no response. 

## 2018-02-06 NOTE — Discharge Instructions (Signed)
You have been seen by your caregiver because of dental pain.  °SEEK MEDICAL ATTENTION IF: °The exam and treatment you received today has been provided on an emergency basis only. This is not a substitute for complete medical or dental care. If your problem worsens or new symptoms (problems) appear, and you are unable to arrange prompt follow-up care with your dentist, call or return to this location. °CALL YOUR DENTIST OR RETURN IMMEDIATELY IF you develop a fever, rash, difficulty breathing or swallowing, neck or facial swelling, or other potentially serious concerns. ° ° °[employer]  °[employer]  °[facility]  °[address]  °[place], [place] [address]  °Phone [phone]  °[facility] [employer] [facility] in [place], [place], exemplifies the Dental School?s vision to improve the health and quality of life of all [place]ians by creating leaders with a passion to care for the underserved and by leading the nation in community-based, service learning oral health education. °We are committed to offering comprehensive general dental services for adults, children and special needs patients in a safe, caring and professional setting. ° °Appointments: Our clinic is open Monday through Friday 8:00 a.m. until 5:00 p.m. The amount of time scheduled for an appointment depends on the patient?s specific needs. We ask that you keep your appointed time for care or provide 24-hour notice of all appointment changes. Parents or legal guardians must accompany minor children. ° °Payment for Services: Medicaid and other insurance plans are welcome. Payment for services is due when services are rendered and may be made by cash or credit card. If you have dental insurance, we will assist you with your claim submission.  ° °Emergencies: Emergency services will be provided Monday through Friday on a  walk-in basis. Please arrive early for emergency services. After hours emergency services will be provided for patients of record as required. ° °Services:  °Comprehensive General Dentistry  °Children?s Dentistry  °Oral Surgery - Extractions  °Root Canals  °Sealants and Tooth Colored Fillings  °Crowns and Bridges  °Dentures and Partial Dentures  °Implant Services  °Periodontal Services and Cleanings  °Cosmetic Tooth Whitening  °Digital Radiography  °3-D/Cone Beam Imaging ° ° °

## 2018-02-06 NOTE — ED Triage Notes (Signed)
Pt reports is in no pain because took a hydrocodone at home.

## 2018-02-06 NOTE — ED Triage Notes (Signed)
Pt to ER for one day tooth ache, states woke up this morning with left upper dental swelling. Pt in NAD.

## 2018-02-06 NOTE — ED Provider Notes (Signed)
[facility] EMERGENCY DEPARTMENT Provider Note   CSN: [id number] Arrival date & time: 02/06/18  4098     History   Chief Complaint Chief Complaint  Patient presents with  . Dental Pain    HPI April Burnett is a 27 y.o. female.  Who presents the emergency department with chief complaint of dental pain.  Patient states that she developed swelling over the left upper molars yesterday which has progressively worsened.  She is associated sinus pain.  She denies trismus, change in voice, difficulty swallowing, fevers or chills.  She is a history of dental infections in the past.  HPI  History reviewed. No pertinent past medical history.  There are no active problems to display for this patient.   History reviewed. No pertinent surgical history.   OB History   None      Home Medications    Prior to Admission medications   Medication Sig Start Date End Date Taking? Authorizing Provider  amoxicillin (AMOXIL) 500 MG capsule Take 1 capsule (500 mg total) by mouth 3 (three) times daily. 02/06/18   Arthor Captain, PA-C  HYDROcodone-acetaminophen (NORCO) 5-325 MG tablet Take 1 tablet by mouth every 6 (six) hours as needed for moderate pain. 02/06/18   Arthor Captain, PA-C  ibuprofen (ADVIL,MOTRIN) 200 MG tablet Take 400-800 mg by mouth every 6 (six) hours as needed for mild pain.    [provider]  levETIRAcetam (KEPPRA) 500 MG tablet Take 1 tablet (500 mg total) by mouth 2 (two) times daily. 09/12/17 10/12/17  Robinson, [place] N, PA-C  levonorgestrel (MIRENA) 20 MCG/24HR IUD 1 each by Intrauterine route once.    [provider]    Family History History reviewed. No pertinent family history.  Social History Social History   Tobacco Use  . Smoking status: Current Every Day Smoker    Packs/day: 0.50    Types: Cigars  . Smokeless tobacco: Never Used  Substance Use Topics  . Alcohol use: No  . Drug use: No     Allergies   Patient has  no known allergies.   Review of Systems Review of Systems  HENT: Positive for dental problem. Negative for trouble swallowing and voice change.      Physical Exam Updated Vital Signs BP 124/67 (BP Location: Right Arm)   Pulse 70   Temp 98.1 F (36.7 C) (Oral)   Resp 16   SpO2 100%   Physical Exam  Constitutional: She is oriented to person, place, and time. She appears well-developed and well-nourished. No distress.  HENT:  Head: Normocephalic and atraumatic.  Left facial swelling, tender less along the apical gumline on the left.  No overt dental caries.  Eyes: Conjunctivae are normal. No scleral icterus.  Neck: Normal range of motion.  Cardiovascular: Normal rate, regular rhythm and normal heart sounds. Exam reveals no gallop and no friction rub.  No murmur heard. Pulmonary/Chest: Effort normal and breath sounds normal. No respiratory distress.  Abdominal: Soft. Bowel sounds are normal. She exhibits no distension and no mass. There is no tenderness. There is no guarding.  Neurological: She is alert and oriented to person, place, and time.  Skin: Skin is warm and dry. She is not diaphoretic.  Psychiatric: Her behavior is normal.  Nursing note and vitals reviewed.    ED Treatments / Results  Labs (all labs ordered are listed, but only abnormal results are displayed) Labs Reviewed - No data to display  EKG None  Radiology No results found.  Procedures Procedures (including critical care time)  Medications Ordered in ED Medications - No data to display   Initial Impression / Assessment and Plan / ED Course  I have reviewed the triage vital signs and the nursing notes.  Pertinent labs & imaging results that were available during my care of the patient were reviewed by me and considered in my medical decision making (see chart for details).     Patient with dentalgia.  No abscess requiring immediate incision and drainage.  Exam not concerning for Ludwig's  angina or pharyngeal abscess. . Pt instructed to follow-up with dentist.  Discussed return precautions. Pt safe for discharge.   Final Clinical Impressions(s) / ED Diagnoses   Final diagnoses:  Dental infection    ED Discharge Orders        Ordered    HYDROcodone-acetaminophen (NORCO) 5-325 MG tablet  Every 6 hours PRN     02/06/18 1254    amoxicillin (AMOXIL) 500 MG capsule  3 times daily     02/06/18 1254       Arthor Captain, PA-C 02/06/18 1631    Shaune Pollack, MD 02/07/18 1330

## 2019-01-23 IMAGING — CT CT HEAD W/O CM
3 series · 16 of 47 positions shown, 19 images · non-contrast
Comparison: None.

CLINICAL DATA: Seizure

EXAM:
CT HEAD WITHOUT CONTRAST
TECHNIQUE: Contiguous axial images were obtained from the base of the skull
through the vertex without intravenous contrast.

[Series 3: head 5.0 h30s · axial · 0.41mm/px · z∈[-100,+40]mm · 10 of 34 slices shown, 13 images]
[im 3/34  brain]
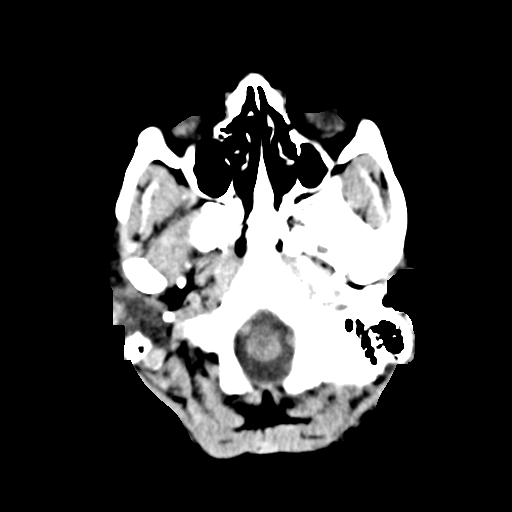
[im 3/34  bone]
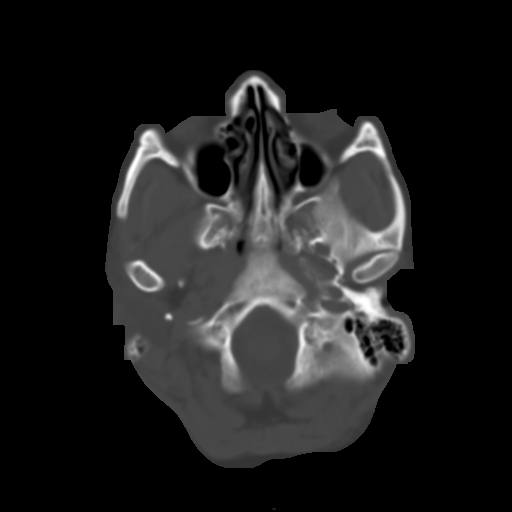
[im 6/34  brain]
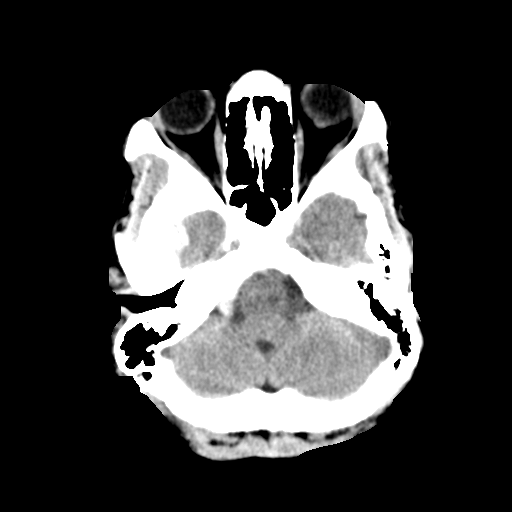
[im 10/34  brain]
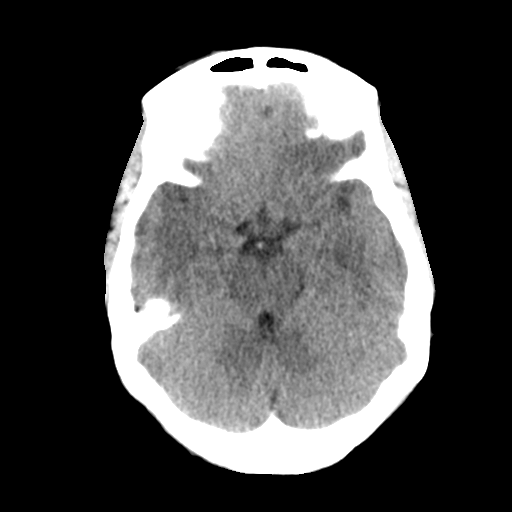
[im 12/34  brain]
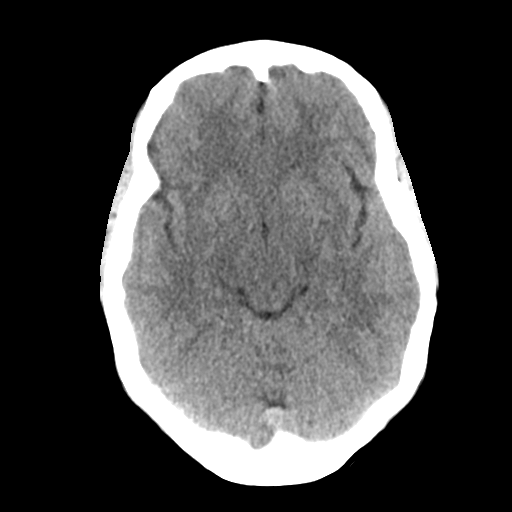
[im 15/34  brain]
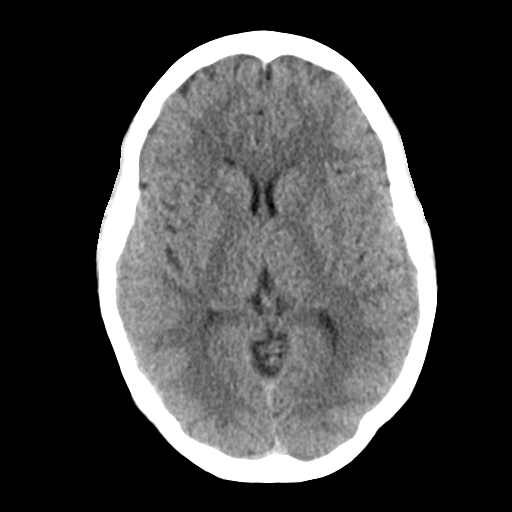
[im 15/34  bone]
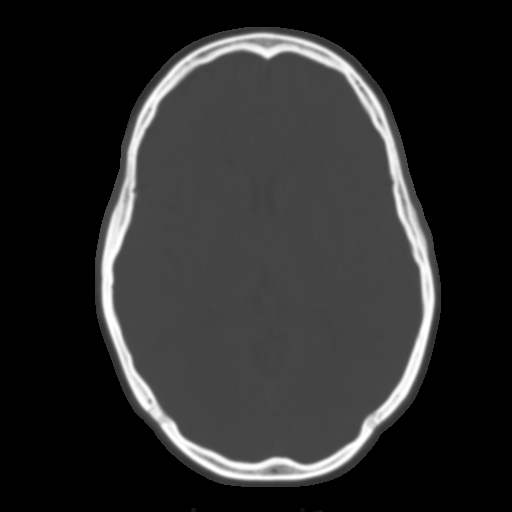
[im 19/34  brain]
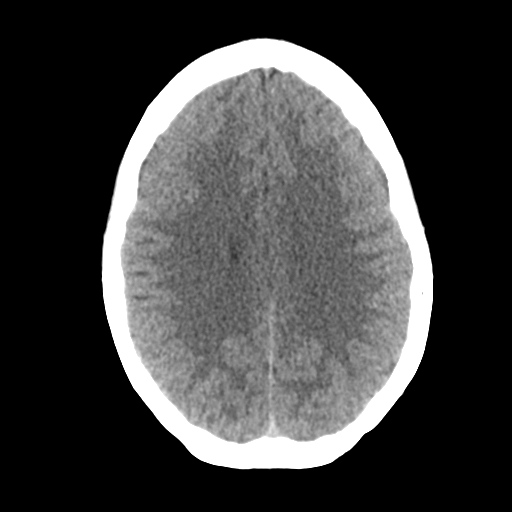
[im 22/34  brain]
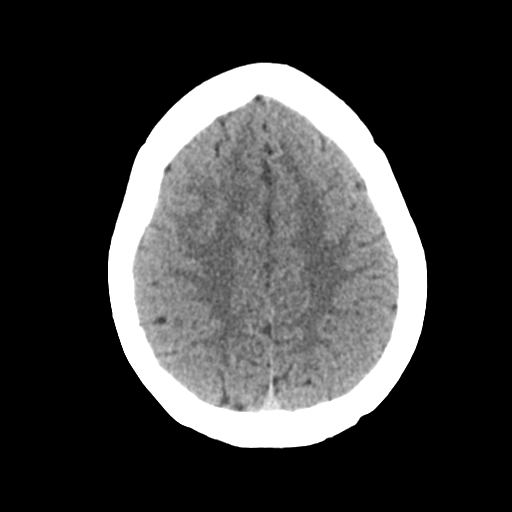
[im 26/34  brain]
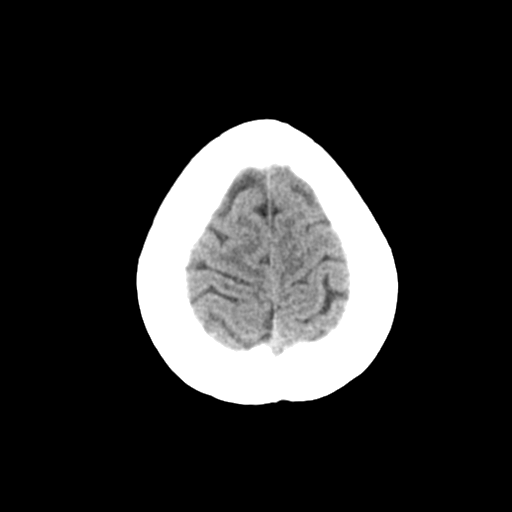
[im 28/34  brain]
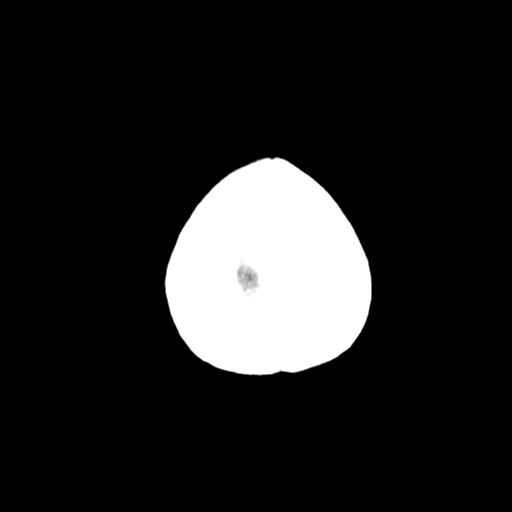
[im 28/34  bone]
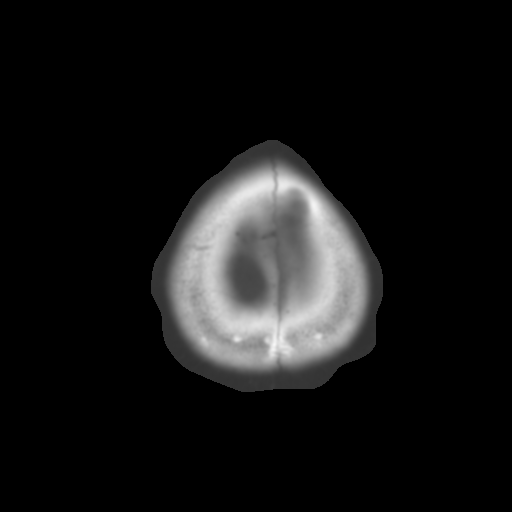
[im 31/34  brain]
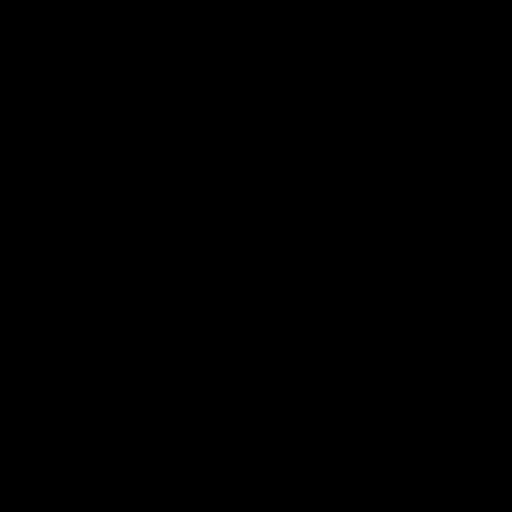

[Series 5: head 3.0 mpr cor · coronal · 0.32mm/px · 3 of 67 slices shown]
[im 23/67  brain]
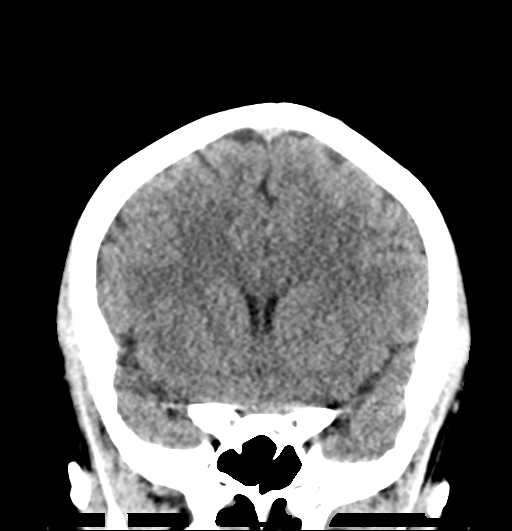
[im 30/67  brain]
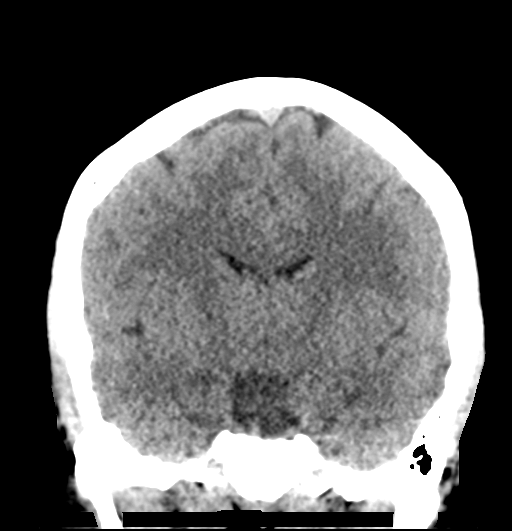
[im 37/67  brain]
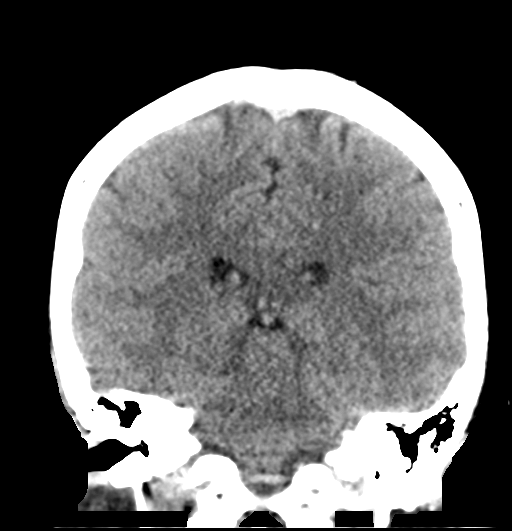

[Series 6: head 3.0 mpr sag · sagittal · 0.33mm/px · 3 of 67 slices shown]
[im 23/67  brain]
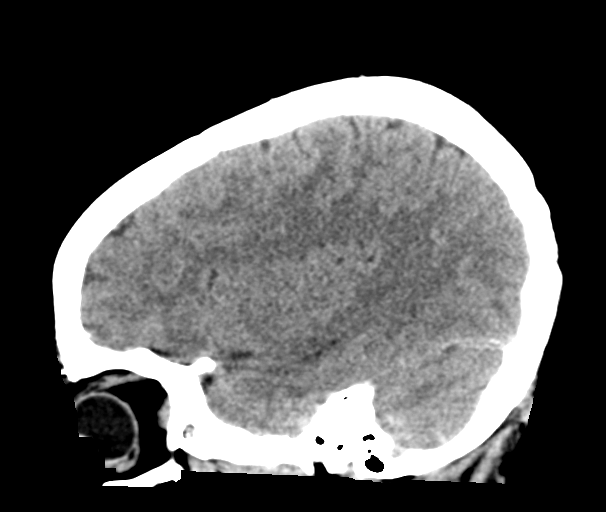
[im 34/67  brain]
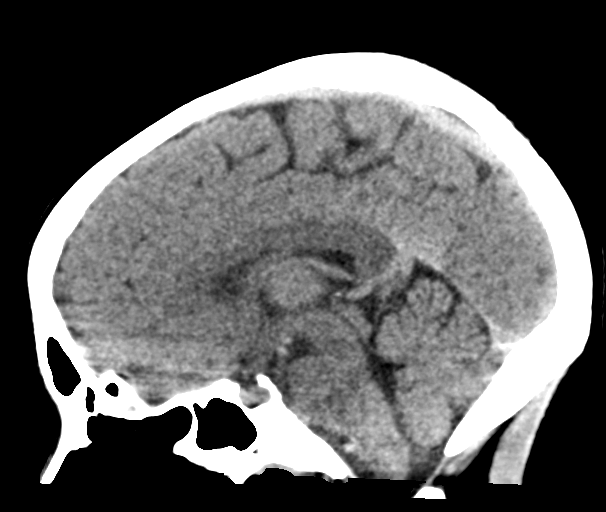
[im 45/67  brain]
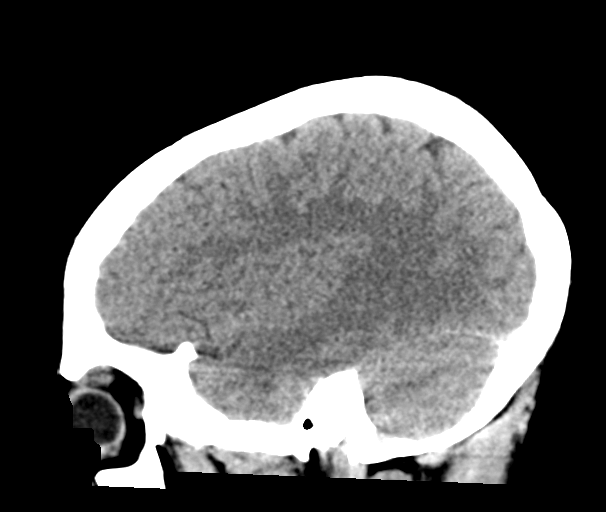

[16 of 47 positions shown; findings below may reference images not displayed]

FINDINGS: Brain: No acute intracranial abnormality. Specifically, no
hemorrhage, hydrocephalus, mass lesion, acute infarction, or
significant intracranial injury.

Vascular: No hyperdense vessel or unexpected calcification.

Skull: No acute calvarial abnormality.

Sinuses/Orbits: Visualized paranasal sinuses and mastoids clear.
Orbital soft tissues unremarkable.

Other: None
IMPRESSION: No acute intracranial abnormality.

## 2019-10-03 ENCOUNTER — Emergency Department (HOSPITAL_COMMUNITY): Payer: No Typology Code available for payment source

## 2019-10-03 ENCOUNTER — Emergency Department (HOSPITAL_COMMUNITY)
Admission: EM | Admit: 2019-10-03 | Discharge: 2019-10-04 | Disposition: A | Discharge status: Home / Self Care | Payer: No Typology Code available for payment source | Attending: Emergency Medicine | Admitting: Emergency Medicine

## 2019-10-03 ENCOUNTER — Encounter (HOSPITAL_COMMUNITY): Payer: Self-pay

## 2019-10-03 ENCOUNTER — Other Ambulatory Visit: Payer: Self-pay

## 2019-10-03 DIAGNOSIS — F17210 Nicotine dependence, cigarettes, uncomplicated: Secondary | ICD-10-CM | POA: Insufficient documentation

## 2019-10-03 DIAGNOSIS — T07XXXA Unspecified multiple injuries, initial encounter: Secondary | ICD-10-CM | POA: Diagnosis present

## 2019-10-03 DIAGNOSIS — R410 Disorientation, unspecified: Secondary | ICD-10-CM | POA: Diagnosis not present

## 2019-10-03 DIAGNOSIS — Y999 Unspecified external cause status: Secondary | ICD-10-CM | POA: Diagnosis not present

## 2019-10-03 DIAGNOSIS — R0789 Other chest pain: Secondary | ICD-10-CM | POA: Diagnosis not present

## 2019-10-03 DIAGNOSIS — Y939 Activity, unspecified: Secondary | ICD-10-CM | POA: Insufficient documentation

## 2019-10-03 DIAGNOSIS — Y929 Unspecified place or not applicable: Secondary | ICD-10-CM | POA: Diagnosis not present

## 2019-10-03 DIAGNOSIS — S0181XA Laceration without foreign body of other part of head, initial encounter: Secondary | ICD-10-CM | POA: Diagnosis not present

## 2019-10-03 DIAGNOSIS — Z79899 Other long term (current) drug therapy: Secondary | ICD-10-CM | POA: Insufficient documentation

## 2019-10-03 DIAGNOSIS — S81012A Laceration without foreign body, left knee, initial encounter: Secondary | ICD-10-CM | POA: Diagnosis not present

## 2019-10-03 LAB — URINALYSIS, ROUTINE W REFLEX MICROSCOPIC
Bacteria, UA: NONE SEEN
Bilirubin Urine: NEGATIVE
Glucose, UA: NEGATIVE mg/dL
Ketones, ur: NEGATIVE mg/dL
Leukocytes,Ua: NEGATIVE
Nitrite: NEGATIVE
Protein, ur: NEGATIVE mg/dL
Specific Gravity, Urine: 1.016 (ref 1.005–1.030)
pH: 6 (ref 5.0–8.0)

## 2019-10-03 LAB — COMPREHENSIVE METABOLIC PANEL
ALT: 14 U/L (ref 0–44)
AST: 24 U/L (ref 15–41)
Albumin: 3.8 g/dL (ref 3.5–5.0)
Alkaline Phosphatase: 53 U/L (ref 38–126)
Anion gap: 13 (ref 5–15)
BUN: 6 mg/dL (ref 6–20)
CO2: 19 mmol/L — ABNORMAL LOW (ref 22–32)
Calcium: 8.7 mg/dL — ABNORMAL LOW (ref 8.9–10.3)
Chloride: 105 mmol/L (ref 98–111)
Creatinine, Ser: 0.79 mg/dL (ref 0.44–1.00)
GFR calc Af Amer: >60 mL/min (ref >60)
GFR calc non Af Amer: >60 mL/min (ref >60)
Glucose, Bld: 167 mg/dL — ABNORMAL HIGH (ref 70–99)
Potassium: 3.3 mmol/L — ABNORMAL LOW (ref 3.5–5.1)
Sodium: 137 mmol/L (ref 135–145)
Total Bilirubin: 0.7 mg/dL (ref 0.3–1.2)
Total Protein: 7 g/dL (ref 6.5–8.1)

## 2019-10-03 LAB — SAMPLE TO BLOOD BANK

## 2019-10-03 LAB — I-STAT CHEM 8, ED
BUN: 7 mg/dL (ref 6–20)
Calcium, Ion: 1.07 mmol/L — ABNORMAL LOW (ref 1.15–1.40)
Chloride: 107 mmol/L (ref 98–111)
Creatinine, Ser: 0.6 mg/dL (ref 0.44–1.00)
Glucose, Bld: 160 mg/dL — ABNORMAL HIGH (ref 70–99)
HCT: 42 % (ref 36.0–46.0)
Hemoglobin: 14.3 g/dL (ref 12.0–15.0)
Potassium: 3.2 mmol/L — ABNORMAL LOW (ref 3.5–5.1)
Sodium: 140 mmol/L (ref 135–145)
TCO2: 20 mmol/L — ABNORMAL LOW (ref 22–32)

## 2019-10-03 LAB — CBC
HCT: 37.7 % (ref 36.0–46.0)
Hemoglobin: 12.4 g/dL (ref 12.0–15.0)
MCH: 27.4 pg (ref 26.0–34.0)
MCHC: 32.9 g/dL (ref 30.0–36.0)
MCV: 83.4 fL (ref 80.0–100.0)
Platelets: 334 10*3/uL (ref 150–400)
RBC: 4.52 MIL/uL (ref 3.87–5.11)
RDW: 15.1 % (ref 11.5–15.5)
WBC: 15.1 10*3/uL — ABNORMAL HIGH (ref 4.0–10.5)
nRBC: 0 % (ref 0.0–0.2)

## 2019-10-03 LAB — ETHANOL: Alcohol, Ethyl (B): <10 mg/dL (ref <10)

## 2019-10-03 LAB — LACTIC ACID, PLASMA: Lactic Acid, Venous: 3.5 mmol/L (ref 0.5–1.9)

## 2019-10-03 LAB — I-STAT BETA HCG BLOOD, ED (MC, WL, AP ONLY): I-stat hCG, quantitative: <5 m[IU]/mL (ref <5)

## 2019-10-03 MED ORDER — HYDROCODONE-ACETAMINOPHEN 5-325 MG PO TABS
2.0000 | ORAL_TABLET | Freq: Once | ORAL | Status: AC
  Administered 2019-10-04: 2 via ORAL
  Filled 2019-10-03: qty 2

## 2019-10-03 MED ORDER — IBUPROFEN 800 MG PO TABS: 800.0000 mg | ORAL_TABLET | Freq: Three times a day (TID) | ORAL | 0 refills | Status: AC

## 2019-10-03 MED ORDER — LIDOCAINE-EPINEPHRINE 1 %-1:100000 IJ SOLN
20.0000 mL | Freq: Once | INTRAMUSCULAR | Status: AC
  Administered 2019-10-03: 20 mL via INTRADERMAL
  Filled 2019-10-03 (×2): qty 20

## 2019-10-03 MED ORDER — HYDROCODONE-ACETAMINOPHEN 5-325 MG PO TABS: 1.0000 | ORAL_TABLET | Freq: Four times a day (QID) | ORAL | 0 refills | Status: AC | PRN

## 2019-10-03 MED ORDER — SODIUM CHLORIDE 0.9 % IV SOLN: INTRAVENOUS | Status: DC

## 2019-10-03 MED ORDER — MORPHINE SULFATE (PF) 4 MG/ML IV SOLN
4.0000 mg | Freq: Once | INTRAVENOUS | Status: AC
  Administered 2019-10-03: 4 mg via INTRAVENOUS
  Filled 2019-10-03: qty 1

## 2019-10-03 MED ORDER — LIDOCAINE-EPINEPHRINE (PF) 1 %-1:200000 IJ SOLN
30.0000 mL | Freq: Once | INTRAMUSCULAR | Status: DC
  Filled 2019-10-03 (×2): qty 30

## 2019-10-03 MED ORDER — KETOROLAC TROMETHAMINE 30 MG/ML IJ SOLN
30.0000 mg | Freq: Once | INTRAMUSCULAR | Status: AC
  Administered 2019-10-03: 30 mg via INTRAVENOUS
  Filled 2019-10-03: qty 1

## 2019-10-03 MED ORDER — SODIUM CHLORIDE 0.9 % IV BOLUS
1000.0000 mL | Freq: Once | INTRAVENOUS | Status: AC
  Administered 2019-10-03: 1000 mL via INTRAVENOUS

## 2019-10-03 MED ORDER — IOHEXOL 300 MG/ML  SOLN
100.0000 mL | Freq: Once | INTRAMUSCULAR | Status: AC | PRN
  Administered 2019-10-03: 100 mL via INTRAVENOUS

## 2019-10-03 MED ORDER — CEFAZOLIN SODIUM-DEXTROSE 2-4 GM/100ML-% IV SOLN
2.0000 g | Freq: Once | INTRAVENOUS | Status: AC
  Administered 2019-10-03: 2 g via INTRAVENOUS
  Filled 2019-10-03: qty 100

## 2019-10-03 MED ORDER — LIDOCAINE HCL (PF) 1 % IJ SOLN
INTRAMUSCULAR | Status: AC
  Filled 2019-10-03: qty 30
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
## 2019-10-03 NOTE — ED Notes (Signed)
Patient transported to CT 

## 2019-10-04 NOTE — ED Provider Notes (Signed)
I was requested by Dr. Johnney Killian to perform laceration repair to L forehead.Marland Kitchen  LACERATION REPAIR Performed by: Domenic Moras Authorized by: Domenic Moras Consent: Verbal consent obtained. Risks and benefits: risks, benefits and alternatives were discussed Consent given by: patient Patient identity confirmed: provided demographic data Prepped and Draped in normal sterile fashion Wound explored  Laceration Location: L forehead  Laceration Length: 6cm  No Foreign Bodies seen or palpated  Anesthesia: local infiltration  Local anesthetic: lidocaine 2% w epinephrine  Anesthetic total: 4 ml  Irrigation method: syringe Amount of cleaning: standard  Skin closure: simple interrupted  Number of sutures: 10  Technique: simple interrupted  Patient tolerance: Patient tolerated the procedure well with no immediate complications.    Domenic Moras, PA-C 10/04/19 Sharl Ma    Charlesetta Shanks, MD 10/04/19 2003

## 2019-10-04 NOTE — Progress Notes (Signed)
Orthopedic Tech Progress Note Patient Details:  April Burnett [birth year] [id number]  Ortho Devices Type of Ortho Device: Crutches, Knee Immobilizer Ortho Device/Splint Location: LLE Ortho Device/Splint Interventions: Ordered, Application, Adjustment   Post Interventions Patient Tolerated: Well Instructions Provided: Adjustment of device, Care of device, Poper ambulation with device   [name] [name] 10/04/2019, 12:30 AM

## 2019-10-04 NOTE — ED Notes (Signed)
Discharge instructions discussed with pt. Pt verbalized understanding. Pt stable and ambulatory. No signature pad available. 

## 2019-10-04 NOTE — ED Notes (Signed)
Ortho tech at bedside placing knee immobolizer and educating on crutches

## 2019-10-04 NOTE — ED Notes (Signed)
Attempted to call and update family. No answer.

## 2019-10-10 ENCOUNTER — Other Ambulatory Visit: Payer: Self-pay

## 2019-10-10 ENCOUNTER — Encounter (HOSPITAL_COMMUNITY): Payer: Self-pay

## 2019-10-10 ENCOUNTER — Ambulatory Visit (HOSPITAL_COMMUNITY)
Admission: EM | Admit: 2019-10-10 | Discharge: 2019-10-10 | Disposition: A | Discharge status: Home / Self Care | Payer: Self-pay | Attending: Urgent Care | Admitting: Urgent Care

## 2019-10-10 DIAGNOSIS — R0781 Pleurodynia: Secondary | ICD-10-CM

## 2019-10-10 DIAGNOSIS — R200 Anesthesia of skin: Secondary | ICD-10-CM

## 2019-10-10 DIAGNOSIS — S0181XA Laceration without foreign body of other part of head, initial encounter: Secondary | ICD-10-CM

## 2019-10-10 DIAGNOSIS — T148XXA Other injury of unspecified body region, initial encounter: Secondary | ICD-10-CM

## 2019-10-10 DIAGNOSIS — R202 Paresthesia of skin: Secondary | ICD-10-CM

## 2019-10-10 DIAGNOSIS — S20211A Contusion of right front wall of thorax, initial encounter: Secondary | ICD-10-CM

## 2019-10-10 DIAGNOSIS — S81812A Laceration without foreign body, left lower leg, initial encounter: Secondary | ICD-10-CM

## 2019-10-10 HISTORY — DX: Unspecified convulsions: R56.9

## 2019-10-10 MED ORDER — NAPROXEN 500 MG PO TABS: 500.0000 mg | ORAL_TABLET | Freq: Two times a day (BID) | ORAL | 0 refills | Status: AC

## 2019-10-10 MED ORDER — CYCLOBENZAPRINE HCL 5 MG PO TABS: 5.0000 mg | ORAL_TABLET | Freq: Three times a day (TID) | ORAL | 0 refills | Status: AC | PRN
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
## 2019-10-10 NOTE — Discharge Instructions (Signed)
Cyclobenzaprine is a muscle relaxer than can make you sleepy. If it does this then just take it at bedtime. Naproxen will help with pain and inflammation, should be taken with food.
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
## 2019-10-14 ENCOUNTER — Ambulatory Visit (HOSPITAL_COMMUNITY)
Admission: EM | Admit: 2019-10-14 | Discharge: 2019-10-14 | Disposition: A | Discharge status: Home / Self Care | Payer: Self-pay

## 2019-10-14 DIAGNOSIS — Z4802 Encounter for removal of sutures: Secondary | ICD-10-CM

## 2019-10-14 NOTE — ED Triage Notes (Signed)
Pt came in to have 10 suture removed from her forehead. Pt tolerated the procedure well.

## 2019-10-19 ENCOUNTER — Ambulatory Visit (HOSPITAL_COMMUNITY)
Admission: EM | Admit: 2019-10-19 | Discharge: 2019-10-19 | Disposition: A | Discharge status: Home / Self Care | Payer: Self-pay

## 2019-10-19 ENCOUNTER — Other Ambulatory Visit: Payer: Self-pay

## 2019-10-19 NOTE — ED Triage Notes (Signed)
Pt here for suture removal in her left knee.  Wallis Bamberg, PA at bedside to evaluate the wound.  He recommends suture removal with use of steri-strips to be replaced every 3 days.    I removed 14 sutures and placed steri-strips to the wound.  Pt instructed on how to use steri-strips, how to remove them and how to replace them.  She was also instructed on signs of infection and to return if she has any issues.

## 2020-06-04 ENCOUNTER — Other Ambulatory Visit: Payer: Self-pay

## 2020-11-02 ENCOUNTER — Ambulatory Visit (HOSPITAL_COMMUNITY): Payer: Self-pay

## 2021-09-30 ENCOUNTER — Encounter (HOSPITAL_COMMUNITY): Payer: Self-pay

## 2021-09-30 ENCOUNTER — Other Ambulatory Visit: Payer: Self-pay

## 2021-09-30 ENCOUNTER — Ambulatory Visit (HOSPITAL_COMMUNITY)
Admission: EM | Admit: 2021-09-30 | Discharge: 2021-09-30 | Disposition: A | Discharge status: Home / Self Care | Payer: Self-pay | Attending: Emergency Medicine | Admitting: Emergency Medicine

## 2021-09-30 DIAGNOSIS — K0889 Other specified disorders of teeth and supporting structures: Secondary | ICD-10-CM

## 2021-09-30 MED ORDER — AMOXICILLIN 875 MG PO TABS: 875.0000 mg | ORAL_TABLET | Freq: Two times a day (BID) | ORAL | 0 refills | Status: AC

## 2021-09-30 NOTE — ED Triage Notes (Signed)
Patient presents to Urgent Care with complaints of right sided upper dental pain x 2 days ago.   Denies fever.

## 2021-09-30 NOTE — Discharge Instructions (Addendum)
Take Amoxicillin twice daily for ten days.  

## 2021-09-30 NOTE — ED Provider Notes (Signed)
[facility]  ____________________________________________  Time seen: Approximately 1:37 PM  I have reviewed the triage vital signs and the nursing notes.   HISTORY  Chief Complaint Dental Pain   Historian Patient     HPI April Burnett is a 30 y.o. female presents to the urgent care for right upper dental pain.  Patient has a broken superior 3.  No difficulty swallowing or pain underneath the tongue.  Patient has not made an appointment with a local dentist yet.   Past Medical History:  Diagnosis Date   Seizures (HCC)      Immunizations up to date:  Yes.     Past Medical History:  Diagnosis Date   Seizures (HCC)     There are no problems to display for this patient.   History reviewed. No pertinent surgical history.  Prior to Admission medications   Medication Sig Start Date End Date Taking? Authorizing Provider  amoxicillin (AMOXIL) 875 MG tablet Take 1 tablet (875 mg total) by mouth 2 (two) times daily for 10 days. 09/30/21 10/10/21 Yes Pia Mau M, PA-C  cyclobenzaprine (FLEXERIL) 5 MG tablet Take 1 tablet (5 mg total) by mouth 3 (three) times daily as needed for muscle spasms. 10/10/19   Wallis Bamberg, PA-C  HYDROcodone-acetaminophen (NORCO/VICODIN) 5-325 MG tablet Take 1 tablet by mouth every 6 (six) hours as needed for moderate pain or severe pain. 10/03/19   Arby Barrette, MD  ibuprofen (ADVIL) 800 MG tablet Take 1 tablet (800 mg total) by mouth 3 (three) times daily. 10/03/19   Arby Barrette, MD  ibuprofen (ADVIL,MOTRIN) 200 MG tablet Take 400-800 mg by mouth every 6 (six) hours as needed for mild pain.    [provider]  levETIRAcetam (KEPPRA) 500 MG tablet Take 1 tablet (500 mg total) by mouth 2 (two) times daily. 09/12/17 10/12/17  Robinson, [place] N, PA-C  levonorgestrel (MIRENA) 20 MCG/24HR IUD 1 each by Intrauterine route once.    [provider]  naproxen (NAPROSYN) 500 MG tablet Take 1 tablet (500 mg total) by  mouth 2 (two) times daily. 10/10/19   Wallis Bamberg, PA-C    Allergies Patient has no known allergies.  History reviewed. No pertinent family history.  Social History Social History   Tobacco Use   Smoking status: Every Day    Packs/day: 0.50    Types: Cigars, Cigarettes   Smokeless tobacco: Never  Substance Use Topics   Alcohol use: No   Drug use: Yes    Types: Marijuana     Review of Systems  Constitutional: No fever/chills Eyes:  No discharge ENT: Patient has broken Superior 3.  Respiratory: no cough. No SOB/ use of accessory muscles to breath Gastrointestinal:   No nausea, no vomiting.  No diarrhea.  No constipation. Musculoskeletal: Negative for musculoskeletal pain. Skin: Negative for rash, abrasions, lacerations, ecchymosis.    ____________________________________________   PHYSICAL EXAM:  VITAL SIGNS: ED Triage Vitals  Enc Vitals Group     BP 09/30/21 1317 116/78     Pulse Rate 09/30/21 1317 69     Resp 09/30/21 1317 16     Temp --      Temp Source 09/30/21 1317 Oral     SpO2 09/30/21 1317 98 %     Weight --      Height --      Head Circumference --      Peak Flow --      Pain Score 09/30/21 1319 9     Pain  Loc --      Pain Edu? --      Excl. in [place]? --      Constitutional: Alert and oriented. Well appearing and in no acute distress. Eyes: Conjunctivae are normal. PERRL. EOMI. Head: Atraumatic. ENT:      Ears:       Nose: No congestion/rhinnorhea.      Mouth/Throat: Mucous membranes are moist.  Patient has broken superior 3.  No pain underneath the tongue. Neck: No stridor.  No cervical spine tenderness to palpation. Cardiovascular: Normal rate, regular rhythm. Normal S1 and S2.  Good peripheral circulation. Respiratory: Normal respiratory effort without tachypnea or retractions. Lungs CTAB. Good air entry to the bases with no decreased or absent breath sounds Gastrointestinal: Bowel sounds x 4 quadrants. Soft and nontender to palpation. No  guarding or rigidity. No distention. Musculoskeletal: Full range of motion to all extremities. No obvious deformities noted Neurologic:  Normal for age. No gross focal neurologic deficits are appreciated.  Skin:  Skin is warm, dry and intact. No rash noted. Psychiatric: Mood and affect are normal for age. Speech and behavior are normal.   ____________________________________________   LABS (all labs ordered are listed, but only abnormal results are displayed)  Labs Reviewed - No data to display ____________________________________________  EKG   ____________________________________________  RADIOLOGY  No results found.  ____________________________________________    PROCEDURES  Procedure(s) performed:     Procedures     Medications - No data to display   ____________________________________________   INITIAL IMPRESSION / ASSESSMENT AND PLAN / ED COURSE  Pertinent labs & imaging results that were available during my care of the patient were reviewed by me and considered in my medical decision making (see chart for details).      Assessment and plan Dental pain 30 year old female presents to the urgent care with right upper dental pain from a broken superior 3.  Patient was started on amoxicillin and advised to seek care with a local dentist as soon as possible.  Tylenol and ibuprofen alternating were recommended for discomfort.  A work note was provided.     ____________________________________________  FINAL CLINICAL IMPRESSION(S) / ED DIAGNOSES  Final diagnoses:  Pain, dental      NEW MEDICATIONS STARTED DURING THIS VISIT:  ED Discharge Orders          Ordered    amoxicillin (AMOXIL) 875 MG tablet  2 times daily        09/30/21 1324                This chart was dictated using voice recognition software/Dragon. Despite best efforts to proofread, errors can occur which can change the meaning. Any change was purely  unintentional.     Orvil Feil, PA-C 09/30/21 1340

## 2022-07-06 ENCOUNTER — Ambulatory Visit
Admission: EM | Admit: 2022-07-06 | Discharge: 2022-07-06 | Disposition: A | Discharge status: Home / Self Care | Payer: Self-pay

## 2022-07-06 DIAGNOSIS — J069 Acute upper respiratory infection, unspecified: Secondary | ICD-10-CM

## 2022-07-06 NOTE — Discharge Instructions (Addendum)
Follow-up here or with your primary care provider if your symptoms worsen or do not resolve within 1 week.    

## 2022-07-06 NOTE — ED Provider Notes (Signed)
[facility]    CSN: [id number] Arrival date & time: 07/06/22  1806      History   Chief Complaint Chief Complaint  Patient presents with   Sore Throat   Nausea    HPI April Burnett is a 31 y.o. female.    Sore Throat    Presents to urgent care with report of sore throat and nausea x2 days.  Denies fever.  Denies respiratory symptoms.  Past Medical History:  Diagnosis Date   Seizures ([place])     There are no problems to display for this patient.   History reviewed. No pertinent surgical history.  OB History   No obstetric history on file.      Home Medications    Prior to Admission medications   Medication Sig Start Date End Date Taking? Authorizing Provider  cyclobenzaprine (FLEXERIL) 5 MG tablet Take 1 tablet (5 mg total) by mouth 3 (three) times daily as needed for muscle spasms. 10/10/19   Jaynee Eagles, PA-C  HYDROcodone-acetaminophen (NORCO/VICODIN) 5-325 MG tablet Take 1 tablet by mouth every 6 (six) hours as needed for moderate pain or severe pain. 10/03/19   Charlesetta Shanks, MD  ibuprofen (ADVIL) 800 MG tablet Take 1 tablet (800 mg total) by mouth 3 (three) times daily. 10/03/19   Charlesetta Shanks, MD  ibuprofen (ADVIL,MOTRIN) 200 MG tablet Take 400-800 mg by mouth every 6 (six) hours as needed for mild pain.    [provider]  levETIRAcetam (KEPPRA) 500 MG tablet Take 1 tablet (500 mg total) by mouth 2 (two) times daily. 09/12/17 10/12/17  Robinson, [place] N, PA-C  levonorgestrel (MIRENA) 20 MCG/24HR IUD 1 each by Intrauterine route once.    [provider]  naproxen (NAPROSYN) 500 MG tablet Take 1 tablet (500 mg total) by mouth 2 (two) times daily. 10/10/19   Jaynee Eagles, PA-C    Family History History reviewed. No pertinent family history.  Social History Social History   Tobacco Use   Smoking status: Every Day    Packs/day: 0.50    Types: Cigars, Cigarettes   Smokeless tobacco: Never  Substance Use Topics   Alcohol  use: No   Drug use: Yes    Types: Marijuana     Allergies   Patient has no known allergies.   Review of Systems Review of Systems   Physical Exam Triage Vital Signs ED Triage Vitals  Enc Vitals Group     BP 07/06/22 1845 113/78     Pulse Rate 07/06/22 1845 92     Resp 07/06/22 1845 16     Temp 07/06/22 1845 98.7 F (37.1 C)     Temp Source 07/06/22 1845 Oral     SpO2 07/06/22 1845 96 %     Weight --      Height --      Head Circumference --      Peak Flow --      Pain Score 07/06/22 1846 7     Pain Loc --      Pain Edu? --      Excl. in [place]? --    No data found.  Updated Vital Signs BP 113/78 (BP Location: Left Arm)   Pulse 92   Temp 98.7 F (37.1 C) (Oral)   Resp 16   SpO2 96%   Visual Acuity Right Eye Distance:   Left Eye Distance:   Bilateral Distance:    Right Eye Near:   Left Eye Near:    Bilateral  Near:     Physical Exam Vitals reviewed.  Constitutional:      Appearance: She is well-developed.  HENT:     Mouth/Throat:     Mouth: Mucous membranes are moist.     Pharynx: Posterior oropharyngeal erythema present. No oropharyngeal exudate.  Cardiovascular:     Rate and Rhythm: Normal rate and regular rhythm.     Heart sounds: Normal heart sounds.  Pulmonary:     Effort: Pulmonary effort is normal.     Breath sounds: Normal breath sounds.  Neurological:     General: No focal deficit present.     Mental Status: She is alert and oriented to person, place, and time.  Psychiatric:        Mood and Affect: Mood normal.        Behavior: Behavior normal.      UC Treatments / Results  Labs (all labs ordered are listed, but only abnormal results are displayed) Labs Reviewed - No data to display  EKG   Radiology No results found.  Procedures Procedures (including critical care time)  Medications Ordered in UC Medications - No data to display  Initial Impression / Assessment and Plan / UC Course  I have reviewed the triage vital signs  and the nursing notes.  Pertinent labs & imaging results that were available during my care of the patient were reviewed by me and considered in my medical decision making (see chart for details).   Rapid strep is negative.  Suspect viral etiology for her symptoms and recommending continued use of OTC medications for symptom control.  Suggested acetaminophen as opposed to ibuprofen for her sore throat given she is experiencing GI upset.   Final Clinical Impressions(s) / UC Diagnoses   Final diagnoses:  None   Discharge Instructions   None    ED Prescriptions   None    PDMP not reviewed this encounter.   Charma Igo, [place] 07/06/22 1912

## 2022-07-06 NOTE — ED Triage Notes (Signed)
Pt. States she has been having a sore throat and nausea for the past two days.
